# Patient Record
Sex: Female | Born: 1950
Health system: Southern US, Community
[De-identification: ages and names within clinical notes are randomized; demographics above are authoritative.]

## PROBLEM LIST (undated history)

## (undated) DIAGNOSIS — J40 Bronchitis, not specified as acute or chronic: Secondary | ICD-10-CM

## (undated) HISTORY — PX: BREAST LUMPECTOMY: SHX2

---

## 1998-09-19 ENCOUNTER — Encounter: Payer: Self-pay | Admitting: Family Medicine

## 1998-09-19 ENCOUNTER — Inpatient Hospital Stay (HOSPITAL_COMMUNITY): Admission: AD | Admit: 1998-09-19 | Discharge: 1998-09-21 | Payer: Self-pay | Admitting: Family Medicine

## 2001-06-08 ENCOUNTER — Other Ambulatory Visit: Admission: RE | Admit: 2001-06-08 | Discharge: 2001-06-08 | Payer: Self-pay | Admitting: Internal Medicine

## 2001-06-15 ENCOUNTER — Encounter: Payer: Self-pay | Admitting: Internal Medicine

## 2001-06-15 ENCOUNTER — Encounter: Admission: RE | Admit: 2001-06-15 | Discharge: 2001-06-15 | Payer: Self-pay | Admitting: Internal Medicine

## 2010-03-05 ENCOUNTER — Emergency Department (HOSPITAL_COMMUNITY): Admission: EM | Admit: 2010-03-05 | Discharge: 2010-03-05 | Payer: Self-pay | Admitting: Emergency Medicine

## 2010-12-09 LAB — PROTIME-INR
INR: 0.97 (ref 0.00–1.49)
Prothrombin Time: 12.8 seconds (ref 11.6–15.2)

## 2010-12-09 LAB — CBC
HCT: 41 % (ref 36.0–46.0)
Hemoglobin: 14.1 g/dL (ref 12.0–15.0)
MCHC: 34.5 g/dL (ref 30.0–36.0)
MCV: 92.4 fL (ref 78.0–100.0)
Platelets: 244 10*3/uL (ref 150–400)
RBC: 4.44 MIL/uL (ref 3.87–5.11)
RDW: 13.5 % (ref 11.5–15.5)
WBC: 14.3 10*3/uL — ABNORMAL HIGH (ref 4.0–10.5)

## 2015-07-13 ENCOUNTER — Other Ambulatory Visit (HOSPITAL_COMMUNITY): Payer: Self-pay | Admitting: Respiratory Therapy

## 2015-07-13 DIAGNOSIS — J45909 Unspecified asthma, uncomplicated: Secondary | ICD-10-CM

## 2016-04-18 DIAGNOSIS — I10 Essential (primary) hypertension: Secondary | ICD-10-CM | POA: Diagnosis not present

## 2016-04-18 DIAGNOSIS — J45909 Unspecified asthma, uncomplicated: Secondary | ICD-10-CM | POA: Diagnosis not present

## 2016-04-18 DIAGNOSIS — R911 Solitary pulmonary nodule: Secondary | ICD-10-CM | POA: Diagnosis not present

## 2016-05-21 ENCOUNTER — Institutional Professional Consult (permissible substitution): Payer: Self-pay | Admitting: Pulmonary Disease

## 2016-07-29 DIAGNOSIS — R05 Cough: Secondary | ICD-10-CM | POA: Diagnosis not present

## 2016-07-29 DIAGNOSIS — J209 Acute bronchitis, unspecified: Secondary | ICD-10-CM | POA: Diagnosis not present

## 2016-08-02 DIAGNOSIS — J209 Acute bronchitis, unspecified: Secondary | ICD-10-CM | POA: Diagnosis not present

## 2016-08-02 DIAGNOSIS — R05 Cough: Secondary | ICD-10-CM | POA: Diagnosis not present

## 2016-08-05 ENCOUNTER — Inpatient Hospital Stay (HOSPITAL_COMMUNITY)
Admission: EM | Admit: 2016-08-05 | Discharge: 2016-08-07 | DRG: 190 | Disposition: A | Discharge status: Home / Self Care | Payer: BLUE CROSS/BLUE SHIELD | Attending: Family Medicine | Admitting: Family Medicine

## 2016-08-05 ENCOUNTER — Emergency Department (HOSPITAL_COMMUNITY): Payer: BLUE CROSS/BLUE SHIELD

## 2016-08-05 ENCOUNTER — Encounter (HOSPITAL_COMMUNITY): Payer: Self-pay | Admitting: Family Medicine

## 2016-08-05 DIAGNOSIS — J9601 Acute respiratory failure with hypoxia: Secondary | ICD-10-CM | POA: Diagnosis not present

## 2016-08-05 DIAGNOSIS — Z7951 Long term (current) use of inhaled steroids: Secondary | ICD-10-CM

## 2016-08-05 DIAGNOSIS — E876 Hypokalemia: Secondary | ICD-10-CM | POA: Diagnosis present

## 2016-08-05 DIAGNOSIS — Z79899 Other long term (current) drug therapy: Secondary | ICD-10-CM

## 2016-08-05 DIAGNOSIS — Z880 Allergy status to penicillin: Secondary | ICD-10-CM | POA: Diagnosis not present

## 2016-08-05 DIAGNOSIS — R739 Hyperglycemia, unspecified: Secondary | ICD-10-CM

## 2016-08-05 DIAGNOSIS — J441 Chronic obstructive pulmonary disease with (acute) exacerbation: Principal | ICD-10-CM | POA: Diagnosis present

## 2016-08-05 DIAGNOSIS — F17210 Nicotine dependence, cigarettes, uncomplicated: Secondary | ICD-10-CM | POA: Diagnosis present

## 2016-08-05 DIAGNOSIS — R197 Diarrhea, unspecified: Secondary | ICD-10-CM | POA: Diagnosis present

## 2016-08-05 DIAGNOSIS — Z91018 Allergy to other foods: Secondary | ICD-10-CM

## 2016-08-05 DIAGNOSIS — R069 Unspecified abnormalities of breathing: Secondary | ICD-10-CM | POA: Diagnosis not present

## 2016-08-05 DIAGNOSIS — E1165 Type 2 diabetes mellitus with hyperglycemia: Secondary | ICD-10-CM | POA: Diagnosis not present

## 2016-08-05 DIAGNOSIS — J988 Other specified respiratory disorders: Secondary | ICD-10-CM | POA: Diagnosis not present

## 2016-08-05 DIAGNOSIS — R05 Cough: Secondary | ICD-10-CM | POA: Diagnosis not present

## 2016-08-05 HISTORY — DX: Bronchitis, not specified as acute or chronic: J40

## 2016-08-05 LAB — CBC WITH DIFFERENTIAL/PLATELET
BASOS ABS: 0 10*3/uL (ref 0.0–0.1)
Basophils Relative: 0 %
Eosinophils Absolute: 0.1 10*3/uL (ref 0.0–0.7)
Eosinophils Relative: 1 %
HEMATOCRIT: 48.8 % — AB (ref 36.0–46.0)
Hemoglobin: 16 g/dL — ABNORMAL HIGH (ref 12.0–15.0)
LYMPHS PCT: 27 %
Lymphs Abs: 3.7 10*3/uL (ref 0.7–4.0)
MCH: 30.7 pg (ref 26.0–34.0)
MCHC: 32.8 g/dL (ref 30.0–36.0)
MCV: 93.7 fL (ref 78.0–100.0)
MONO ABS: 0.8 10*3/uL (ref 0.1–1.0)
Monocytes Relative: 6 %
NEUTROS ABS: 9.3 10*3/uL — AB (ref 1.7–7.7)
Neutrophils Relative %: 66 %
Platelets: 253 10*3/uL (ref 150–400)
RBC: 5.21 MIL/uL — AB (ref 3.87–5.11)
RDW: 13.5 % (ref 11.5–15.5)
WBC: 13.8 10*3/uL — AB (ref 4.0–10.5)

## 2016-08-05 LAB — COMPREHENSIVE METABOLIC PANEL
ALT: 19 U/L (ref 14–54)
AST: 16 U/L (ref 15–41)
Albumin: 3.6 g/dL (ref 3.5–5.0)
Alkaline Phosphatase: 92 U/L (ref 38–126)
Anion gap: 12 (ref 5–15)
BUN: 12 mg/dL (ref 6–20)
CO2: 34 mmol/L — ABNORMAL HIGH (ref 22–32)
Calcium: 8.6 mg/dL — ABNORMAL LOW (ref 8.9–10.3)
Chloride: 95 mmol/L — ABNORMAL LOW (ref 101–111)
Creatinine, Ser: 0.58 mg/dL (ref 0.44–1.00)
GFR calc Af Amer: >60 mL/min (ref >60)
GFR calc non Af Amer: >60 mL/min (ref >60)
Glucose, Bld: 280 mg/dL — ABNORMAL HIGH (ref 65–99)
Potassium: 2.9 mmol/L — ABNORMAL LOW (ref 3.5–5.1)
Sodium: 141 mmol/L (ref 135–145)
Total Bilirubin: 0.7 mg/dL (ref 0.3–1.2)
Total Protein: 6.6 g/dL (ref 6.5–8.1)

## 2016-08-05 LAB — BRAIN NATRIURETIC PEPTIDE: B NATRIURETIC PEPTIDE 5: 32.6 pg/mL (ref 0.0–100.0)

## 2016-08-05 LAB — BLOOD GAS, VENOUS
Acid-Base Excess: 9.3 mmol/L — ABNORMAL HIGH (ref 0.0–2.0)
Bicarbonate: 36.2 mmol/L — ABNORMAL HIGH (ref 20.0–28.0)
Drawn by: 295031
O2 Content: 3 L/min
O2 Saturation: 89.4 %
Patient temperature: 98.6
pCO2, Ven: 58.4 mmHg (ref 44.0–60.0)
pH, Ven: 7.409 (ref 7.250–7.430)
pO2, Ven: 60.6 mmHg — ABNORMAL HIGH (ref 32.0–45.0)

## 2016-08-05 LAB — I-STAT TROPONIN, ED: Troponin i, poc: 0.02 ng/mL (ref 0.00–0.08)

## 2016-08-05 LAB — GLUCOSE, CAPILLARY
GLUCOSE-CAPILLARY: 422 mg/dL — AB (ref 65–99)
GLUCOSE-CAPILLARY: 517 mg/dL — AB (ref 65–99)

## 2016-08-05 LAB — MAGNESIUM: MAGNESIUM: 1.4 mg/dL — AB (ref 1.7–2.4)

## 2016-08-05 MED ORDER — HYDROCODONE-ACETAMINOPHEN 5-325 MG PO TABS: 1.0000 | ORAL_TABLET | ORAL | Status: DC | PRN

## 2016-08-05 MED ORDER — LEVOFLOXACIN IN D5W 750 MG/150ML IV SOLN
750.0000 mg | INTRAVENOUS | Status: DC
  Administered 2016-08-06 (×2): 750 mg via INTRAVENOUS
  Filled 2016-08-05 (×2): qty 150

## 2016-08-05 MED ORDER — ADULT MULTIVITAMIN W/MINERALS CH
1.0000 | ORAL_TABLET | Freq: Every day | ORAL | Status: DC
  Administered 2016-08-06 – 2016-08-07 (×2): 1 via ORAL
  Filled 2016-08-05 (×2): qty 1

## 2016-08-05 MED ORDER — INSULIN ASPART 100 UNIT/ML ~~LOC~~ SOLN
10.0000 [IU] | Freq: Once | SUBCUTANEOUS | Status: AC
  Administered 2016-08-05: 10 [IU] via SUBCUTANEOUS

## 2016-08-05 MED ORDER — SODIUM CHLORIDE 0.9% FLUSH
3.0000 mL | Freq: Two times a day (BID) | INTRAVENOUS | Status: DC
  Administered 2016-08-06 – 2016-08-07 (×2): 3 mL via INTRAVENOUS

## 2016-08-05 MED ORDER — SODIUM CHLORIDE 0.9 % IV BOLUS (SEPSIS)
1000.0000 mL | Freq: Once | INTRAVENOUS | Status: AC
  Administered 2016-08-05: 1000 mL via INTRAVENOUS

## 2016-08-05 MED ORDER — IPRATROPIUM-ALBUTEROL 0.5-2.5 (3) MG/3ML IN SOLN
3.0000 mL | Freq: Once | RESPIRATORY_TRACT | Status: AC
  Administered 2016-08-05: 3 mL via RESPIRATORY_TRACT
  Filled 2016-08-05: qty 3

## 2016-08-05 MED ORDER — NICOTINE 21 MG/24HR TD PT24
21.0000 mg | MEDICATED_PATCH | Freq: Every day | TRANSDERMAL | Status: DC
  Administered 2016-08-05 – 2016-08-07 (×3): 21 mg via TRANSDERMAL
  Filled 2016-08-05 (×4): qty 1

## 2016-08-05 MED ORDER — DIPHENHYDRAMINE HCL 25 MG PO CAPS
25.0000 mg | ORAL_CAPSULE | Freq: Four times a day (QID) | ORAL | Status: DC | PRN
  Administered 2016-08-05 – 2016-08-06 (×2): 25 mg via ORAL
  Filled 2016-08-05 (×2): qty 1

## 2016-08-05 MED ORDER — CYANOCOBALAMIN 500 MCG PO TABS
500.0000 ug | ORAL_TABLET | Freq: Every day | ORAL | Status: DC
  Administered 2016-08-06 – 2016-08-07 (×2): 500 ug via ORAL
  Filled 2016-08-05 (×2): qty 1

## 2016-08-05 MED ORDER — POTASSIUM CHLORIDE CRYS ER 20 MEQ PO TBCR
40.0000 meq | EXTENDED_RELEASE_TABLET | Freq: Once | ORAL | Status: AC
Start: 2016-08-05 — End: 2016-08-05
  Administered 2016-08-05: 40 meq via ORAL
  Filled 2016-08-05: qty 2

## 2016-08-05 MED ORDER — ENOXAPARIN SODIUM 40 MG/0.4ML ~~LOC~~ SOLN
40.0000 mg | SUBCUTANEOUS | Status: DC
  Administered 2016-08-05 – 2016-08-06 (×2): 40 mg via SUBCUTANEOUS
  Filled 2016-08-05 (×2): qty 0.4

## 2016-08-05 MED ORDER — MOMETASONE FURO-FORMOTEROL FUM 100-5 MCG/ACT IN AERO
2.0000 | INHALATION_SPRAY | Freq: Two times a day (BID) | RESPIRATORY_TRACT | Status: DC
  Administered 2016-08-05 – 2016-08-07 (×4): 2 via RESPIRATORY_TRACT
  Filled 2016-08-05: qty 8.8

## 2016-08-05 MED ORDER — SODIUM CHLORIDE 0.9% FLUSH: 3.0000 mL | INTRAVENOUS | Status: DC | PRN

## 2016-08-05 MED ORDER — IBUPROFEN 200 MG PO TABS
400.0000 mg | ORAL_TABLET | Freq: Four times a day (QID) | ORAL | Status: DC | PRN
  Administered 2016-08-05 – 2016-08-06 (×2): 400 mg via ORAL
  Filled 2016-08-05 (×2): qty 2

## 2016-08-05 MED ORDER — SODIUM CHLORIDE 0.9 % IV SOLN: 250.0000 mL | INTRAVENOUS | Status: DC | PRN

## 2016-08-05 MED ORDER — FLUTICASONE PROPIONATE 50 MCG/ACT NA SUSP
1.0000 | Freq: Every day | NASAL | Status: DC | PRN
  Filled 2016-08-05: qty 16

## 2016-08-05 MED ORDER — ONDANSETRON HCL 4 MG/2ML IJ SOLN: 4.0000 mg | Freq: Four times a day (QID) | INTRAMUSCULAR | Status: DC | PRN

## 2016-08-05 MED ORDER — ONDANSETRON HCL 4 MG PO TABS: 4.0000 mg | ORAL_TABLET | Freq: Four times a day (QID) | ORAL | Status: DC | PRN

## 2016-08-05 MED ORDER — POTASSIUM CHLORIDE 2 MEQ/ML IV SOLN
30.0000 meq | Freq: Once | INTRAVENOUS | Status: AC
  Administered 2016-08-05: 30 meq via INTRAVENOUS
  Filled 2016-08-05: qty 15

## 2016-08-05 MED ORDER — MAGNESIUM SULFATE 2 GM/50ML IV SOLN
2.0000 g | Freq: Once | INTRAVENOUS | Status: AC
  Administered 2016-08-05: 2 g via INTRAVENOUS
  Filled 2016-08-05: qty 50

## 2016-08-05 MED ORDER — METHYLPREDNISOLONE SODIUM SUCC 125 MG IJ SOLR
60.0000 mg | Freq: Four times a day (QID) | INTRAMUSCULAR | Status: DC
  Administered 2016-08-05: 60 mg via INTRAVENOUS
  Filled 2016-08-05: qty 2

## 2016-08-05 MED ORDER — INSULIN ASPART 100 UNIT/ML ~~LOC~~ SOLN: 0.0000 [IU] | Freq: Every day | SUBCUTANEOUS | Status: DC

## 2016-08-05 MED ORDER — METHYLPREDNISOLONE SODIUM SUCC 125 MG IJ SOLR
60.0000 mg | Freq: Three times a day (TID) | INTRAMUSCULAR | Status: DC
  Administered 2016-08-06 – 2016-08-07 (×4): 60 mg via INTRAVENOUS
  Filled 2016-08-05 (×4): qty 2

## 2016-08-05 MED ORDER — ZOLPIDEM TARTRATE 5 MG PO TABS: 5.0000 mg | ORAL_TABLET | Freq: Every evening | ORAL | Status: DC | PRN

## 2016-08-05 MED ORDER — IPRATROPIUM-ALBUTEROL 0.5-2.5 (3) MG/3ML IN SOLN: 3.0000 mL | RESPIRATORY_TRACT | Status: DC | PRN

## 2016-08-05 MED ORDER — SODIUM CHLORIDE 0.9% FLUSH
3.0000 mL | Freq: Two times a day (BID) | INTRAVENOUS | Status: DC
  Administered 2016-08-05 – 2016-08-06 (×2): 3 mL via INTRAVENOUS

## 2016-08-05 MED ORDER — INSULIN ASPART 100 UNIT/ML ~~LOC~~ SOLN
0.0000 [IU] | Freq: Three times a day (TID) | SUBCUTANEOUS | Status: DC
  Administered 2016-08-06: 9 [IU] via SUBCUTANEOUS
  Administered 2016-08-06: 5 [IU] via SUBCUTANEOUS
  Administered 2016-08-06: 7 [IU] via SUBCUTANEOUS
  Administered 2016-08-07 (×2): 9 [IU] via SUBCUTANEOUS

## 2016-08-05 NOTE — ED Provider Notes (Signed)
[facility]-EMERGENCY DEPT Provider Note   CSN: [id number] Arrival date & time: 08/05/16  1709     History   Chief Complaint Chief Complaint  Patient presents with  . Cough    HPI Amanda Barajas is a 65 y.o. female.  The history is provided by the patient.  Cough  This is a new problem. The current episode started more than 1 week ago. The problem occurs constantly. The problem has been gradually worsening. The cough is productive of sputum. There has been no fever. Associated symptoms include shortness of breath and wheezing. Pertinent negatives include no chest pain. Treatments tried: prednisone 40 mg. The treatment provided no relief. She is a smoker. Her past medical history is significant for COPD.    Past Medical History:  Diagnosis Date  . Bronchitis     Patient Active Problem List   Diagnosis Date Noted  . COPD with acute exacerbation (HCC) 08/05/2016  . Hypokalemia 08/05/2016  . Hyperglycemia 08/05/2016  . Acute respiratory failure with hypoxia (HCC) 08/05/2016    Past Surgical History:  Procedure Laterality Date  . BREAST LUMPECTOMY Left    age 74 - was benign  . CESAREAN SECTION     x 3    OB History    No data available       Home Medications    Prior to Admission medications   Medication Sig Start Date End Date Taking? Authorizing Provider  albuterol (PROVENTIL HFA;VENTOLIN HFA) 108 (90 Base) MCG/ACT inhaler Inhale 1-2 puffs into the lungs every 4 (four) hours as needed for wheezing or shortness of breath.   Yes Historical Provider, MD  albuterol (PROVENTIL) (2.5 MG/3ML) 0.083% nebulizer solution Take 2.5 mg by nebulization every 4 (four) hours as needed for wheezing or shortness of breath.   Yes Historical Provider, MD  azithromycin (ZITHROMAX) 250 MG tablet Take 250-500 mg by mouth daily. Take 500mg  on day 1, then 250mg  for 4 days  Started 11/11 for 5 days   Yes Historical Provider, MD  budesonide-formoterol (SYMBICORT) 80-4.5 MCG/ACT inhaler Inhale  2 puffs into the lungs 2 (two) times daily.   Yes Historical Provider, MD  diphenhydramine-acetaminophen (TYLENOL PM) 25-500 MG TABS tablet Take 2 tablets by mouth at bedtime.   Yes Historical Provider, MD  fluticasone (FLONASE) 50 MCG/ACT nasal spray Place 1 spray into both nostrils daily as needed for allergies or rhinitis.   Yes Historical Provider, MD  ibuprofen (ADVIL,MOTRIN) 200 MG tablet Take 400 mg by mouth every 6 (six) hours as needed for fever, headache, mild pain, moderate pain or cramping.   Yes Historical Provider, MD  Ipratropium-Albuterol (COMBIVENT RESPIMAT) 20-100 MCG/ACT AERS respimat Inhale 1 puff into the lungs 4 (four) times daily.   Yes Historical Provider, MD  Multiple Vitamin (MULTIVITAMIN WITH MINERALS) TABS tablet Take 1 tablet by mouth daily.   Yes Historical Provider, MD  predniSONE (DELTASONE) 20 MG tablet Take 10 mg by mouth daily. Started 11/11 for 10 days   Yes Historical Provider, MD  vitamin B-12 (CYANOCOBALAMIN) 500 MCG tablet Take 500 mcg by mouth daily.   Yes Historical Provider, MD    Family History Family History  Problem Relation Age of Onset  . Heart attack Mother   . Emphysema Mother   . Heart attack Father     Social History Social History  Substance Use Topics  . Smoking status: Current Every Day Smoker    Packs/day: 2.00    Years: 46.00  . Smokeless tobacco: Never Used  .  Alcohol use No     Allergies   Penicillins and Eggs or egg-derived products   Review of Systems Review of Systems  Constitutional: Positive for fatigue. Negative for fever.  Respiratory: Positive for cough, shortness of breath and wheezing.   Cardiovascular: Negative for chest pain.  All other systems reviewed and are negative.    Physical Exam Updated Vital Signs BP (!) 158/72 (BP Location: Right Arm)   Pulse 88   Temp 99.1 F (37.3 C) (Oral)   Resp (!) 22   Ht 5\' 2"  (1.575 m)   Wt 192 lb 9.6 oz (87.4 kg)   SpO2 93%   BMI 35.23 kg/m   Physical Exam   Constitutional: She is oriented to person, place, and time. She appears well-developed and well-nourished.  HENT:  Head: Normocephalic and atraumatic.  Eyes: Right eye exhibits no discharge.  Cardiovascular: Normal rate, regular rhythm and normal heart sounds.   No murmur heard. Pulmonary/Chest: She has wheezes. She has rales.  tachypnea  Abdominal: Soft. She exhibits no distension. There is no tenderness.  Neurological: She is oriented to person, place, and time.  Skin: Skin is warm and dry. She is not diaphoretic.  Psychiatric: She has a normal mood and affect.  Nursing note and vitals reviewed.    ED Treatments / Results  Labs (all labs ordered are listed, but only abnormal results are displayed) Labs Reviewed  COMPREHENSIVE METABOLIC PANEL - Abnormal; Notable for the following:       Result Value   Potassium 2.9 (*)    Chloride 95 (*)    CO2 34 (*)    Glucose, Bld 280 (*)    Calcium 8.6 (*)    All other components within normal limits  CBC WITH DIFFERENTIAL/PLATELET - Abnormal; Notable for the following:    WBC 13.8 (*)    RBC 5.21 (*)    Hemoglobin 16.0 (*)    HCT 48.8 (*)    Neutro Abs 9.3 (*)    All other components within normal limits  BLOOD GAS, VENOUS - Abnormal; Notable for the following:    pO2, Ven 60.6 (*)    Bicarbonate 36.2 (*)    Acid-Base Excess 9.3 (*)    All other components within normal limits  MAGNESIUM - Abnormal; Notable for the following:    Magnesium 1.4 (*)    All other components within normal limits  GLUCOSE, CAPILLARY - Abnormal; Notable for the following:    Glucose-Capillary 422 (*)    All other components within normal limits  GLUCOSE, CAPILLARY - Abnormal; Notable for the following:    Glucose-Capillary 517 (*)    All other components within normal limits  CULTURE, EXPECTORATED SPUTUM-ASSESSMENT  BRAIN NATRIURETIC PEPTIDE  BASIC METABOLIC PANEL  HEMOGLOBIN A1C  MAGNESIUM  I-STAT TROPOININ, ED    EKG  EKG  Interpretation  Date/Time:  Tuesday August 05 2016 19:12:48 EST Ventricular Rate:  96 PR Interval:    QRS Duration: 100 QT Interval:  375 QTC Calculation: 472 R Axis:   111 Text Interpretation:  Sinus rhythm Left atrial enlargement Anterolateral infarct, old Baseline wander in lead(s) I II III aVR aVL V1 V2 V3 V4 V5 V6 No significant change since last tracing Confirmed by Kandis MannanMACKUEN, COURTNEY ([id number]) on 08/05/2016 7:31:29 PM       Radiology Dg Chest 2 View  Result Date: 08/05/2016 CLINICAL DATA:  Possible pneumonia, recent completion of the Z-Pak , still feeling unwell. Productive cough. EXAM: CHEST  2 VIEW COMPARISON:  None.  FINDINGS: Cardiac silhouette is upper limits of normal, mediastinal silhouette is nonsuspicious, mildly calcified aortic knob. No pleural effusion or focal consolidation. No pneumothorax. Soft tissue planes and included osseous structures are nonsuspicious. Severe cervical facet arthropathy. IMPRESSION: Borderline cardiomegaly, no acute pulmonary process. Electronically Signed   By: Awilda Metroourtnay  Bloomer M.D.   On: 08/05/2016 19:02    Procedures Procedures (including critical care time)  Medications Ordered in ED Medications  mometasone-formoterol (DULERA) 100-5 MCG/ACT inhaler 2 puff (2 puffs Inhalation Given 08/05/16 2337)  fluticasone (FLONASE) 50 MCG/ACT nasal spray 1 spray (not administered)  ibuprofen (ADVIL,MOTRIN) tablet 400 mg (400 mg Oral Given 08/05/16 2347)  multivitamin with minerals tablet 1 tablet (not administered)  cyanocobalamin tablet 500 mcg (not administered)  enoxaparin (LOVENOX) injection 40 mg (40 mg Subcutaneous Given 08/05/16 2314)  sodium chloride flush (NS) 0.9 % injection 3 mL (3 mLs Intravenous Given 08/05/16 2200)  sodium chloride flush (NS) 0.9 % injection 3 mL (0 mLs Intravenous Duplicate 08/05/16 2200)  sodium chloride flush (NS) 0.9 % injection 3 mL (not administered)  0.9 %  sodium chloride infusion (not administered)   HYDROcodone-acetaminophen (NORCO/VICODIN) 5-325 MG per tablet 1-2 tablet (not administered)  ondansetron (ZOFRAN) tablet 4 mg (not administered)    Or  ondansetron (ZOFRAN) injection 4 mg (not administered)  insulin aspart (novoLOG) injection 0-9 Units (not administered)  insulin aspart (novoLOG) injection 0-5 Units (0 Units Subcutaneous Not Given 08/05/16 2340)  ipratropium-albuterol (DUONEB) 0.5-2.5 (3) MG/3ML nebulizer solution 3 mL (not administered)  levofloxacin (LEVAQUIN) IVPB 750 mg (750 mg Intravenous Given 08/06/16 0040)  nicotine (NICODERM CQ - dosed in mg/24 hours) patch 21 mg (21 mg Transdermal Patch Applied 08/05/16 2313)  potassium chloride 30 mEq in sodium chloride 0.9 % 265 mL (KCL MULTIRUN) IVPB (30 mEq Intravenous Given 08/05/16 2314)  zolpidem (AMBIEN) tablet 5 mg (not administered)  diphenhydrAMINE (BENADRYL) capsule 25-50 mg (25 mg Oral Given 08/05/16 2347)  methylPREDNISolone sodium succinate (SOLU-MEDROL) 125 mg/2 mL injection 60 mg (not administered)  ipratropium-albuterol (DUONEB) 0.5-2.5 (3) MG/3ML nebulizer solution 3 mL (3 mLs Nebulization Given 08/05/16 1838)  sodium chloride 0.9 % bolus 1,000 mL (1,000 mLs Intravenous New Bag/Given 08/05/16 1901)  potassium chloride SA (K-DUR,KLOR-CON) CR tablet 40 mEq (40 mEq Oral Given 08/05/16 1949)  magnesium sulfate IVPB 2 g 50 mL (2 g Intravenous Given 08/05/16 2314)  insulin aspart (novoLOG) injection 10 Units (10 Units Subcutaneous Given 08/05/16 2347)     Initial Impression / Assessment and Plan / ED Course  I have reviewed the triage vital signs and the nursing notes.  Pertinent labs & imaging results that were available during my care of the patient were reviewed by me and considered in my medical decision making (see chart for details).  Clinical Course     Pt is a former smoker (quit 4 days ago) here with worsening wheezing SOB for the last week. [facility]. Has been getting  worse.  Pt dips to 86% on RA, has coarse breath sounds and wheezing throughout.  Will start duonebs, solumedrol given by EMS and get CXR.  CXR shows no infection, will treat for COPD exacerbation. 125 solumedrol given PTA   Will need to admit for new oxygen dependence.   Final Clinical Impressions(s) / ED Diagnoses   Final diagnoses:  None    New Prescriptions Current Discharge Medication List       Rontae Inglett Randall AnLyn Okie Bogacz, MD 08/06/16 0121

## 2016-08-05 NOTE — ED Triage Notes (Signed)
Pt was seen by PCP on 08/02/16 and dx with possible PNA; pt wad rx'd Z-pack. Pt began to feel better but still not normal. Pt returned to PCP where she was advised to seek further evaluation at ED. Pt presents with productive cough with dark green sputum, shob, and decreased O2 sat. Pt recently quit smoking.

## 2016-08-05 NOTE — ED Notes (Signed)
Respiratory called for breathing treatment.

## 2016-08-05 NOTE — ED Notes (Signed)
Hospitalist at bedside 

## 2016-08-05 NOTE — H&P (Signed)
History and Physical    Amanda Barajas ZOX:[id number] DOB: 02-16-1951 DOA: 08/05/2016  PCP: Jarrett SohoWharton, Courtney, PA-C   Patient coming from: Home, by way of urgent care  Chief Complaint: Dyspnea, cough, low sat  HPI: Amanda JohnDebra B Heath is a 65 y.o. female with medical history significant for COPD and tobacco abuse, presenting to the emergency department at the direction of an urgent care provider for evaluation of hypoxia. Patient reports that she had developed increased cough and increased dyspnea with wheezing a little over a week ago and was seen in an urgent care on 08/02/2016, diagnosed with acute bronchitis, and treated with azithromycin and prednisone. Patient reports some mild improvement with these measures, but her family at the bedside report that she appears to be worsening with increased work of breathing and increased breathlessness with speech. For this reason, she was taken back to urgent care today for follow-up, was noted to be saturating in the low 80s on room air, and was directed to the ED for further evaluation. Patient denies any chest pain or palpitations, and denies any lower extremity edema or orthopnea. She reports a cough productive of thick green sputum for approximately one week now and dyspnea, initially with exertion, but now at rest, and audible wheezing. She reports that her last cigarette was 2 days prior to admission and she declares that she has quit. Patient was treated with 125 mg of IV Solu-Medrol at the urgent care prior to her arrival in the ED.  ED Course: Upon arrival to the ED, patient is found to be afebrile, saturating 82% on room air, mildly tachypneic, mildly hypertensive, then with vitals otherwise stable. EKG demonstrates sinus rhythm with left atrial enlargement and chest x-ray is negative for acute cardiopulmonary disease. CMP is notable for potassium of 2.9, bicarbonate of 34, and serum glucose of 280. CBC is notable for a leukocytosis to 13,800 and  polycythemia with hemoglobin of 16.0. Troponin is within the normal limits. Patient was given 1 L of normal saline, 40 mEq of oral potassium, and treated with DuoNeb. Despite these measures, patient continues to require supplemental oxygen while at rest and is dyspneic with minimal exertion and even with speech. She will be admitted to the telemetry unit for ongoing evaluation and management of acute hypoxic respiratory failure suspected secondary to acute exacerbation and COPD.  Review of Systems:  All other systems reviewed and apart from HPI, are negative.  Past Medical History:  Diagnosis Date  . Bronchitis     Past Surgical History:  Procedure Laterality Date  . BREAST LUMPECTOMY Left    age 65 - was benign  . CESAREAN SECTION     x 3     reports that she has been smoking.  She has a 92.00 pack-year smoking history. She has never used smokeless tobacco. She reports that she does not drink alcohol or use drugs.  Allergies  Allergen Reactions  . Penicillins Hives    Has patient had a PCN reaction causing immediate rash, facial/tongue/throat swelling, SOB or lightheadedness with hypotension: Yes Has patient had a PCN reaction causing severe rash involving mucus membranes or skin necrosis: Yes Has patient had a PCN reaction that required hospitalization No Has patient had a PCN reaction occurring within the last 10 years: No  If all of the above answers are "NO", then may proceed with Cephalosporin use.   . Eggs Or Egg-Derived Products Diarrhea and Nausea And Vomiting    Family History  Problem Relation Age of  Onset  . Heart attack Mother   . Emphysema Mother   . Heart attack Father      Prior to Admission medications   Medication Sig Start Date End Date Taking? Authorizing Provider  albuterol (PROVENTIL HFA;VENTOLIN HFA) 108 (90 Base) MCG/ACT inhaler Inhale 1-2 puffs into the lungs every 4 (four) hours as needed for wheezing or shortness of breath.   Yes Historical  Provider, MD  albuterol (PROVENTIL) (2.5 MG/3ML) 0.083% nebulizer solution Take 2.5 mg by nebulization every 4 (four) hours as needed for wheezing or shortness of breath.   Yes Historical Provider, MD  azithromycin (ZITHROMAX) 250 MG tablet Take 250-500 mg by mouth daily. Take 500mg  on day 1, then 250mg  for 4 days  Started 11/11 for 5 days   Yes Historical Provider, MD  budesonide-formoterol (SYMBICORT) 80-4.5 MCG/ACT inhaler Inhale 2 puffs into the lungs 2 (two) times daily.   Yes Historical Provider, MD  diphenhydramine-acetaminophen (TYLENOL PM) 25-500 MG TABS tablet Take 2 tablets by mouth at bedtime.   Yes Historical Provider, MD  fluticasone (FLONASE) 50 MCG/ACT nasal spray Place 1 spray into both nostrils daily as needed for allergies or rhinitis.   Yes Historical Provider, MD  ibuprofen (ADVIL,MOTRIN) 200 MG tablet Take 400 mg by mouth every 6 (six) hours as needed for fever, headache, mild pain, moderate pain or cramping.   Yes Historical Provider, MD  Ipratropium-Albuterol (COMBIVENT RESPIMAT) 20-100 MCG/ACT AERS respimat Inhale 1 puff into the lungs 4 (four) times daily.   Yes Historical Provider, MD  Multiple Vitamin (MULTIVITAMIN WITH MINERALS) TABS tablet Take 1 tablet by mouth daily.   Yes Historical Provider, MD  predniSONE (DELTASONE) 20 MG tablet Take 10 mg by mouth daily. Started 11/11 for 10 days   Yes Historical Provider, MD  vitamin B-12 (CYANOCOBALAMIN) 500 MCG tablet Take 500 mcg by mouth daily.   Yes Historical Provider, MD    Physical Exam: Vitals:   08/05/16 1719 08/05/16 1721 08/05/16 1742 08/05/16 1950  BP: 174/79   (!) 144/113  Pulse: 97   97  Resp: 22   (!) 27  Temp: 98.7 F (37.1 C)     TempSrc: Oral     SpO2: (!) 82%  91% 91%  Weight:  87.1 kg (192 lb)    Height:  5\' 2"  (1.575 m)        Constitutional: Mildly tachypneic, calm, comfortable Eyes: PERTLA, lids and conjunctivae normal ENMT: Mucous membranes are moist. Posterior pharynx clear of any exudate  or lesions.   Neck: normal, supple, no masses, no thyromegaly Respiratory: Diminished bilaterally with prolonged expiratory phase and scattered wheezes bilaterally. No pallor.  Cardiovascular: Rate ~80 and regular with no significant murmur appreciated. No significant JVD. Abdomen: No distension, no tenderness, no masses palpated. Bowel sounds normal.  Musculoskeletal: no clubbing / cyanosis. No joint deformity upper and lower extremities. Normal muscle tone.  Skin: no significant rashes, lesions, ulcers. Warm, dry, well-perfused. Neurologic: CN 2-12 grossly intact. Sensation intact, DTR normal. Strength 5/5 in all 4 limbs.  Psychiatric: Normal judgment and insight. Alert and oriented x 3. Normal mood and affect.     Labs on Admission: I have personally reviewed following labs and imaging studies  CBC:  Recent Labs Lab 08/05/16 1829  WBC 13.8*  NEUTROABS 9.3*  HGB 16.0*  HCT 48.8*  MCV 93.7  PLT 253   Basic Metabolic Panel:  Recent Labs Lab 08/05/16 1829  NA 141  K 2.9*  CL 95*  CO2 34*  GLUCOSE  280*  BUN 12  CREATININE 0.58  CALCIUM 8.6*   GFR: Estimated Creatinine Clearance: 74.7 mL/min (by C-G formula based on SCr of 0.58 mg/dL). Liver Function Tests:  Recent Labs Lab 08/05/16 1829  AST 16  ALT 19  ALKPHOS 92  BILITOT 0.7  PROT 6.6  ALBUMIN 3.6   No results for input(s): LIPASE, AMYLASE in the last 168 hours. No results for input(s): AMMONIA in the last 168 hours. Coagulation Profile: No results for input(s): INR, PROTIME in the last 168 hours. Cardiac Enzymes: No results for input(s): CKTOTAL, CKMB, CKMBINDEX, TROPONINI in the last 168 hours. BNP (last 3 results) No results for input(s): PROBNP in the last 8760 hours. HbA1C: No results for input(s): HGBA1C in the last 72 hours. CBG: No results for input(s): GLUCAP in the last 168 hours. Lipid Profile: No results for input(s): CHOL, HDL, LDLCALC, TRIG, CHOLHDL, LDLDIRECT in the last 72  hours. Thyroid Function Tests: No results for input(s): TSH, T4TOTAL, FREET4, T3FREE, THYROIDAB in the last 72 hours. Anemia Panel: No results for input(s): VITAMINB12, FOLATE, FERRITIN, TIBC, IRON, RETICCTPCT in the last 72 hours. Urine analysis: No results found for: COLORURINE, APPEARANCEUR, LABSPEC, PHURINE, GLUCOSEU, HGBUR, BILIRUBINUR, KETONESUR, PROTEINUR, UROBILINOGEN, NITRITE, LEUKOCYTESUR Sepsis Labs: @LABRCNTIP (procalcitonin:4,lacticidven:4) )No results found for this or any previous visit (from the past 240 hour(s)).   Radiological Exams on Admission: Dg Chest 2 View  Result Date: 08/05/2016 CLINICAL DATA:  Possible pneumonia, recent completion of the Z-Pak , still feeling unwell. Productive cough. EXAM: CHEST  2 VIEW COMPARISON:  None. FINDINGS: Cardiac silhouette is upper limits of normal, mediastinal silhouette is nonsuspicious, mildly calcified aortic knob. No pleural effusion or focal consolidation. No pneumothorax. Soft tissue planes and included osseous structures are nonsuspicious. Severe cervical facet arthropathy. IMPRESSION: Borderline cardiomegaly, no acute pulmonary process. Electronically Signed   By: Awilda Metroourtnay  Bloomer M.D.   On: 08/05/2016 19:02    EKG: Independently reviewed. Sinus rhythm, LAE  Assessment/Plan  1. COPD with acute exacerbation, acute hypoxic respiratory failure - Pt was started on azithromycin and prednisone on 08/02/16 after visiting a urgent care, but family was concerned she was continuing to worsen and she is now hypoxic in the low 80s while at rest  - There is no edema or orthopnea; CXR is neg for acute pathology  - She was treated with 125 mg IV Solu-Medrol en route and given nebs in ED - Plan to continue her scheduled ICS/LABA, prn DuoNeb, Solu-Medrol; check sputum culture and start Levaquin   2. Hypokalemia  - Serum potassium is 2.9 on admission  - Likely secondary to diarrhea, which patient said she gets every time she goes on  prednisone burst - She was given 40 mEq oral potassium in ED and 30 mEq IV potassium on admission  - Check chem panel with mag level in am, monitor on telemetry   3. Hyperglycemia  - Serum glucose 280 on admission without hx of DM, likely secondary to steroids, and possibly undiagnosed DM  - Plan to check CBG with meals and qHS; check A1c  - Start a low-intensity SSI and adjust prn     DVT prophylaxis: sq Lovenox  Code Status: Full  Family Communication: Husband and son updated at bedside at patient's request Disposition Plan: Admit to telemetry Consults called: none Admission status: Inpatient    Briscoe Deutscherimothy S Opyd, MD [facility] Pager [phone]  If 7PM-7AM, please contact night-coverage [email or web address] Password [email or web address]  08/05/2016, 8:18 PM

## 2016-08-05 NOTE — ED Notes (Signed)
ED Provider at bedside. 

## 2016-08-05 NOTE — ED Notes (Signed)
Bed: WA02 Expected date:  Expected time:  Means of arrival:  Comments: EMS-PNA/SOB

## 2016-08-06 ENCOUNTER — Encounter (HOSPITAL_COMMUNITY): Payer: Self-pay | Admitting: Family Medicine

## 2016-08-06 DIAGNOSIS — J441 Chronic obstructive pulmonary disease with (acute) exacerbation: Principal | ICD-10-CM

## 2016-08-06 LAB — BASIC METABOLIC PANEL
ANION GAP: 9 (ref 5–15)
BUN: 15 mg/dL (ref 6–20)
CALCIUM: 8 mg/dL — AB (ref 8.9–10.3)
CO2: 33 mmol/L — ABNORMAL HIGH (ref 22–32)
Chloride: 97 mmol/L — ABNORMAL LOW (ref 101–111)
Creatinine, Ser: 0.65 mg/dL (ref 0.44–1.00)
GFR calc Af Amer: >60 mL/min (ref >60)
Glucose, Bld: 382 mg/dL — ABNORMAL HIGH (ref 65–99)
POTASSIUM: 4.3 mmol/L (ref 3.5–5.1)
SODIUM: 139 mmol/L (ref 135–145)

## 2016-08-06 LAB — GLUCOSE, CAPILLARY
GLUCOSE-CAPILLARY: 287 mg/dL — AB (ref 65–99)
GLUCOSE-CAPILLARY: 366 mg/dL — AB (ref 65–99)
Glucose-Capillary: 372 mg/dL — ABNORMAL HIGH (ref 65–99)
Glucose-Capillary: 341 mg/dL — ABNORMAL HIGH (ref 65–99)
Glucose-Capillary: 420 mg/dL — ABNORMAL HIGH (ref 65–99)

## 2016-08-06 LAB — MAGNESIUM: MAGNESIUM: 2.1 mg/dL (ref 1.7–2.4)

## 2016-08-06 MED ORDER — INSULIN ASPART 100 UNIT/ML ~~LOC~~ SOLN
10.0000 [IU] | Freq: Once | SUBCUTANEOUS | Status: AC
  Administered 2016-08-06: 10 [IU] via SUBCUTANEOUS

## 2016-08-06 NOTE — Progress Notes (Signed)
OT Cancellation Note  Patient Details Name: Amanda JohnDebra B Tavis MRN: [id number] DOB: [birth year]   Cancelled Treatment:    Reason Eval/Treat Not Completed: OT screened, no needs identified, will sign off  Keyle Doby, [place] KungLorraine D 08/06/2016, 1:31 PM

## 2016-08-06 NOTE — Progress Notes (Signed)
Initial Nutrition Assessment  DOCUMENTATION CODES:   Obesity unspecified  INTERVENTION:  Spoke with [facility] to clarify that patient does not have true egg allergy and that she can tolerate boiled eggs, and baked products containing eggs.  Encouraged adequate intake of calories and protein at meals in setting of increased needs during acute exacerbation. Spoke with patient about good options she can have here or items that will be well-tolerated during any future exacerbations when appetite is poor.  NUTRITION DIAGNOSIS:   Increased nutrient needs related to catabolic illness (COPD exacerbation on steroid taper) as evidenced by estimated needs.  GOAL:   Patient will meet greater than or equal to 90% of their needs  MONITOR:   PO intake, Labs, Weight trends, I & O's  REASON FOR ASSESSMENT:   Consult Assessment of nutrition requirement/status  ASSESSMENT:   65 y.o. female with medical history significant for COPD and tobacco abuse, presenting to the emergency department at the direction of an urgent care provider for evaluation of hypoxia. Found to have acute COPD exacerbation and acute hypoxic respiratory failure.   Spoke with patient at bedside. Reports exacerbation symptoms began about 1 week ago. During that time she had a poor appetite and decreased intake (only finishing 50% of meals). Her typical intake is 2 meals daily (shown below). Denies N/V or abdominal pain. Reports diarrhea with prednisone. Patient also reports that she had mistakenly told someone on admission she had an egg allergy. Reports it is not a true allergy as she is still able to tolerate boiled eggs, and eggs baked in other products. Only cannot tolerate scrambled eggs so she wants the allergy taken off so she can eat more foods. Patient reports appetite back to normal now and she is finishing meals - had finished 100% of breakfast at time of assessment. Reports she does not want an ONS or snacks  ordered.  Typical Intake:  Breakfast - pack of crackers at work [employer]unch - leftovers or Administrator, [employer] Servicesandwich Dinner - "full meal" (meat/fish, rice/bread, vegetables)  Meal Completion: 100%  Medications reviewed and include: Novolog sliding scale TID with meals and daily at bedtime, methylprednisolone 60 mg Q8hrs, multivitamin with  Minerals daily.  Labs reviewed: CBG 287-517 past 24 hrs, Chloride 97, CO2 33. Potassium now WNL after repletion - was low upon admission in setting of diarrhea.  Nutrition-Focused physical exam completed. Findings are no fat depletion, no muscle depletion, and no edema.   Diet Order:  Diet regular Room service appropriate? Yes; Fluid consistency: Thin  Skin:  Reviewed, no issues  Last BM:  Unknown  Height:   Ht Readings from Last 1 Encounters:  08/05/16 5\' 2"  (1.575 m)    Weight:   Wt Readings from Last 1 Encounters:  08/06/16 195 lb 11.2 oz (88.8 kg)    Ideal Body Weight:  50 kg  BMI:  Body mass index is 35.79 kg/m.  Estimated Nutritional Needs:   Kcal:  1825-1965 (MSJ x 1.3-1.4)  Protein:  90-115 grams (1-1.3 grams/kg)  Fluid:  1.8-2 L/day   EDUCATION NEEDS:   Education needs addressed (Increased calorie and protein needs during exacerbation. Reviewed protein foods with patient and encouraged intake.)  Amanda RimaLeanne Keiarah Orlowski, MS, RD, LDN Pager: [phone] After Hours Pager: [phone]

## 2016-08-06 NOTE — Progress Notes (Signed)
SATURATION QUALIFICATIONS: (This note is used to comply with regulatory documentation for home oxygen)  Patient Saturations on Room Air at Rest = 94-96%   Patient Saturations on Room Air while Ambulating = 77-80%  Patient Saturations on Liters of oxygen while Ambulating =   Please briefly explain why patient needs home oxygen: Patient desats into 70's and 80's while ambulating on RA

## 2016-08-06 NOTE — Evaluation (Signed)
Physical Therapy Evaluation Patient Details Name: Arlys JohnDebra B Hurd MRN: [id number] DOB: [birth year] Today's Date: 08/06/2016   History of Present Illness  65 y.o. female with medical history significant for COPD and tobacco abuse, presented to the emergency department for evaluation of hypoxia  Clinical Impression  Patient evaluated by Physical Therapy with no further acute PT needs identified. All education has been completed and the patient has no further questions.  See below for any follow-up Physical Therapy or equipment needs. PT is signing off. Thank you for this referral.  SATURATION QUALIFICATIONS: (This note is used to comply with regulatory documentation for home oxygen)  Patient Saturations on Room Air at Rest = 86%  Patient Saturations on Room Air while Ambulating = n/a  Patient Saturations on 4 Liters of oxygen while Ambulating = 88%  Please briefly explain why patient needs home oxygen: to maintain oxygen saturations above 88% at rest  (pt wearing acrylic nails however good wave form during during assessment)        Follow Up Recommendations No PT follow up    Equipment Recommendations  None recommended by PT    Recommendations for Other Services       Precautions / Restrictions Precautions Precautions: None      Mobility  Bed Mobility Overal bed mobility: Modified Independent                Transfers Overall transfer level: Modified independent                  Ambulation/Gait Ambulation/Gait assistance: Supervision;Modified independent (Device/Increase time) Ambulation Distance (Feet): 200 Feet Assistive device: None Gait Pattern/deviations: Step-through pattern     General Gait Details: pt with steady gait, denies SOB, required 4L O2 [place] for SPO2 88%  Stairs            Wheelchair Mobility    Modified Rankin (Stroke Patients Only)       Balance Overall balance assessment: No apparent balance deficits (not formally  assessed)                                           Pertinent Vitals/Pain Pain Assessment: No/denies pain  Pt was on 2.5 L O2 [place] in room SpO2 86% at rest on room air SpO2 86-88% on 3L during ambulation, improved to maintain 88% on 4L O2 [place] during ambulation    Home Living Family/patient expects to be discharged to:: Private residence Living Arrangements: Spouse/significant other;Children (daughter)   Type of Home: House       Home Layout: Two level;Able to live on main level with bedroom/bathroom Home Equipment: None      Prior Function           Comments: pt works, reports daughter lives on second level     Hand Dominance        Extremity/Trunk Assessment               Lower Extremity Assessment: Overall WFL for tasks assessed         Communication   Communication: No difficulties  Cognition Arousal/Alertness: Awake/alert Behavior During Therapy: WFL for tasks assessed/performed Overall Cognitive Status: Within Functional Limits for tasks assessed                      General Comments      Exercises     Assessment/Plan  PT Assessment Patent does not need any further PT services  PT Problem List            PT Treatment Interventions      PT Goals (Current goals can be found in the Care Plan section)  Acute Rehab PT Goals PT Goal Formulation: All assessment and education complete, DC therapy    Frequency     Barriers to discharge        Co-evaluation               End of Session Equipment Utilized During Treatment: Oxygen Activity Tolerance: Patient tolerated treatment well Patient left: in bed           Time: [id number] PT Time Calculation (min) (ACUTE ONLY): 12 min   Charges:   PT Evaluation $PT Eval Low Complexity: 1 Procedure     PT G Codes:        Richardson Dubree,KATHrine E 08/06/2016, 11:55 AM Zenovia JarredKati Domanique Luckett, PT, DPT 08/06/2016 Pager: [phone]

## 2016-08-06 NOTE — Progress Notes (Signed)
PROGRESS NOTE  Amanda Barajas  WUJ:[id number] DOB: [birth year] DOA: 08/05/2016 PCP: Jarrett Soho, PA-C   Brief Narrative: Amanda Barajas is a 65 y.o. female with a history of COPD and tobacco use who was sent from urgent care for hypoxemia. She had suffered from worsening cough productive of green sputum and dyspnea increasing even at rest. She sought care at urgent care on 11/11, returning 11/14 after no improvement with azithromycin and prednisone 10mg  daily. SpO2 on reevaluation was 80%, so she was given solumedrol 125mg  and sent to the ED. On arrival she was afebrile, 82% on room air, tachypneic with diminished lung sounds and wheezing, and mildly hypertensive. CXR showed no active cardiopulmonary disease. WBC 13.8k, hgb 16mg /dl, she was given IV fluids, oral potassium for K of 2.9, and a duoneb which provided subjective improvement. She remained hypoxemic, prompting admission for acute respiratory failure due to acute COPD exacerbation.  Assessment & Plan: Principal Problem:   COPD with acute exacerbation (HCC) Active Problems:   Hypokalemia   Hyperglycemia   Acute respiratory failure with hypoxia (HCC)  Acute hypoxemic respiratory failure due to acute COPD exacerbation: Failed outpatient management with azithro/prednisone. No evidence of CHF or pneumonia.  - Continue ICS/LABA, duonebs prn, wean solumedrol quickly to po 11/16.  - Follow up sputum culture - Continue levaquin >> po 11/16  Hypokalemia  - Serum potassium is 2.9 on admission  - Likely secondary to diarrhea, which patient said she gets every time she goes on prednisone burst - She was given 40 mEq oral potassium in ED and 30 mEq IV potassium on admission  - Check chem panel with mag level in am, monitor on telemetry   3. Hyperglycemia  - Serum glucose 280 on admission without hx of DM, likely secondary to steroids, and possibly undiagnosed DM  - Plan to check CBG with meals and qHS; check A1c  - Start a  low-intensity SSI and adjust prn    DVT prophylaxis: Lovenox Code Status: Full Family Communication: No family at bedside this AM Disposition Plan: Discharge to home with home oxygen in next 24 hours.  Consultants:   None  Procedures:   None  Antimicrobials:  Levaquin 11/14 >> 11/20   Subjective: Pt feels marginally better breathing since admission last night. No chest pain, N/V/fever, leg swelling or palpitations.   Objective: Vitals:   08/05/16 2000 08/05/16 2122 08/06/16 0507 08/06/16 1534  BP: 138/79 (!) 158/72 (!) 145/61 (!) 142/70  Pulse: 93 88 88 86  Resp: 25 (!) 22 20 19   Temp:  99.1 F (37.3 C) 98.1 F (36.7 C) 98.1 F (36.7 C)  TempSrc:  Oral Oral Oral  SpO2: 91% 93% 96% 94%  Weight:  87.4 kg (192 lb 9.6 oz) 88.8 kg (195 lb 11.2 oz)   Height:  5\' 2"  (1.575 m)      Intake/Output Summary (Last 24 hours) at 08/06/16 2008 Last data filed at 08/06/16 1844  Gross per 24 hour  Intake             1185 ml  Output                5 ml  Net             1180 ml   Filed Weights   08/05/16 1721 08/05/16 2122 08/06/16 0507  Weight: 87.1 kg (192 lb) 87.4 kg (192 lb 9.6 oz) 88.8 kg (195 lb 11.2 oz)    Examination: General exam: 65 y.o. female in  no distress Respiratory system: Mildly labored breathing 4L by [place] out of breath at the end of sentences. Clear to auscultation bilaterally.  Cardiovascular system: Regular rate and rhythm. No murmur, rub, or gallop. No JVD, and no pedal edema. Gastrointestinal system: Abdomen soft, non-tender, non-distended, with normoactive bowel sounds. No organomegaly or masses felt. Central nervous system: Alert and oriented. No focal neurological deficits. Extremities: Warm, no deformities Skin: No rashes, lesions no ulcers Psychiatry: Judgement and insight appear normal. Mood & affect appropriate.   Data Reviewed: I have personally reviewed following labs and imaging studies  CBC:  Recent Labs Lab 08/05/16 1829  WBC 13.8*    NEUTROABS 9.3*  HGB 16.0*  HCT 48.8*  MCV 93.7  PLT 253   Basic Metabolic Panel:  Recent Labs Lab 08/05/16 1829 08/06/16 0335  NA 141 139  K 2.9* 4.3  CL 95* 97*  CO2 34* 33*  GLUCOSE 280* 382*  BUN 12 15  CREATININE 0.58 0.65  CALCIUM 8.6* 8.0*  MG 1.4* 2.1   GFR: Estimated Creatinine Clearance: 75.5 mL/min (by C-G formula based on SCr of 0.65 mg/dL). Liver Function Tests:  Recent Labs Lab 08/05/16 1829  AST 16  ALT 19  ALKPHOS 92  BILITOT 0.7  PROT 6.6  ALBUMIN 3.6   No results for input(s): LIPASE, AMYLASE in the last 168 hours. No results for input(s): AMMONIA in the last 168 hours. Coagulation Profile: No results for input(s): INR, PROTIME in the last 168 hours. Cardiac Enzymes: No results for input(s): CKTOTAL, CKMB, CKMBINDEX, TROPONINI in the last 168 hours. BNP (last 3 results) No results for input(s): PROBNP in the last 8760 hours. HbA1C: No results for input(s): HGBA1C in the last 72 hours. CBG:  Recent Labs Lab 08/05/16 2333 08/06/16 0227 08/06/16 0743 08/06/16 1430 08/06/16 1634  GLUCAP 517* 372* 287* 366* 341*   Lipid Profile: No results for input(s): CHOL, HDL, LDLCALC, TRIG, CHOLHDL, LDLDIRECT in the last 72 hours. Thyroid Function Tests: No results for input(s): TSH, T4TOTAL, FREET4, T3FREE, THYROIDAB in the last 72 hours. Anemia Panel: No results for input(s): VITAMINB12, FOLATE, FERRITIN, TIBC, IRON, RETICCTPCT in the last 72 hours. Urine analysis: No results found for: COLORURINE, APPEARANCEUR, LABSPEC, PHURINE, GLUCOSEU, HGBUR, BILIRUBINUR, KETONESUR, PROTEINUR, UROBILINOGEN, NITRITE, LEUKOCYTESUR Sepsis Labs: @LABRCNTIP (procalcitonin:4,lacticidven:4)  )No results found for this or any previous visit (from the past 240 hour(s)).   Radiology Studies: Dg Chest 2 View  Result Date: 08/05/2016 CLINICAL DATA:  Possible pneumonia, recent completion of the Z-Pak , still feeling unwell. Productive cough. EXAM: CHEST  2 VIEW  COMPARISON:  None. FINDINGS: Cardiac silhouette is upper limits of normal, mediastinal silhouette is nonsuspicious, mildly calcified aortic knob. No pleural effusion or focal consolidation. No pneumothorax. Soft tissue planes and included osseous structures are nonsuspicious. Severe cervical facet arthropathy. IMPRESSION: Borderline cardiomegaly, no acute pulmonary process. Electronically Signed   By: Awilda Metroourtnay  Bloomer M.D.   On: 08/05/2016 19:02    Scheduled Meds: . cyanocobalamin  500 mcg Oral Daily  . enoxaparin (LOVENOX) injection  40 mg Subcutaneous Q24H  . insulin aspart  0-5 Units Subcutaneous QHS  . insulin aspart  0-9 Units Subcutaneous TID WC  . levofloxacin (LEVAQUIN) IV  750 mg Intravenous Q24H  . methylPREDNISolone (SOLU-MEDROL) injection  60 mg Intravenous Q8H  . mometasone-formoterol  2 puff Inhalation BID  . multivitamin with minerals  1 tablet Oral Daily  . nicotine  21 mg Transdermal Daily  . sodium chloride flush  3 mL Intravenous Q12H  .  sodium chloride flush  3 mL Intravenous Q12H   Continuous Infusions:   LOS: 1 day   Time spent: 25 minutes.  Hazeline Junkeryan Athene Schuhmacher, MD [facility] Pager [phone]  If 7PM-7AM, please contact night-coverage [email or web address] Password [email or web address] 08/06/2016, 8:08 PM

## 2016-08-07 LAB — HEMOGLOBIN A1C
HEMOGLOBIN A1C: 10.6 % — AB (ref 4.8–5.6)
Mean Plasma Glucose: 258 mg/dL

## 2016-08-07 LAB — GLUCOSE, CAPILLARY
GLUCOSE-CAPILLARY: 376 mg/dL — AB (ref 65–99)
Glucose-Capillary: 387 mg/dL — ABNORMAL HIGH (ref 65–99)

## 2016-08-07 MED ORDER — PREDNISONE 20 MG PO TABS: 40.0000 mg | ORAL_TABLET | Freq: Every day | ORAL | 0 refills | Status: AC

## 2016-08-07 MED ORDER — INSULIN GLARGINE 100 UNIT/ML SOLOSTAR PEN: 15.0000 [IU] | PEN_INJECTOR | Freq: Every day | SUBCUTANEOUS | 0 refills | Status: AC

## 2016-08-07 MED ORDER — INSULIN STARTER KIT- PEN NEEDLES (ENGLISH): 1.0000 | Freq: Once | 0 refills | Status: AC

## 2016-08-07 MED ORDER — METFORMIN HCL ER 500 MG PO TB24
500.0000 mg | ORAL_TABLET | Freq: Every day | ORAL | Status: DC
  Filled 2016-08-07: qty 1

## 2016-08-07 MED ORDER — METFORMIN HCL ER 500 MG PO TB24: 500.0000 mg | ORAL_TABLET | Freq: Every day | ORAL | 0 refills | Status: AC

## 2016-08-07 MED ORDER — LEVOFLOXACIN 750 MG PO TABS: 750.0000 mg | ORAL_TABLET | Freq: Every day | ORAL | 0 refills | Status: AC

## 2016-08-07 MED ORDER — PREDNISONE 20 MG PO TABS: 40.0000 mg | ORAL_TABLET | Freq: Every day | ORAL | 0 refills | Status: DC

## 2016-08-07 MED ORDER — INSULIN STARTER KIT- PEN NEEDLES (ENGLISH): 1.0000 | Freq: Once | 0 refills | Status: DC

## 2016-08-07 MED ORDER — INSULIN STARTER KIT- PEN NEEDLES (ENGLISH)
1.0000 | Freq: Once | Status: DC
  Filled 2016-08-07: qty 1

## 2016-08-07 MED ORDER — INSULIN PEN NEEDLE 31G X 5 MM MISC: 0 refills | Status: DC

## 2016-08-07 MED ORDER — LEVOFLOXACIN 750 MG PO TABS: 750.0000 mg | ORAL_TABLET | Freq: Every day | ORAL | 0 refills | Status: DC

## 2016-08-07 MED ORDER — METHYLPREDNISOLONE SODIUM SUCC 125 MG IJ SOLR: 60.0000 mg | Freq: Two times a day (BID) | INTRAMUSCULAR | Status: DC

## 2016-08-07 MED ORDER — INSULIN PEN NEEDLE 31G X 5 MM MISC: 0 refills | Status: AC

## 2016-08-07 MED ORDER — BLOOD GLUCOSE METER KIT: PACK | 0 refills | Status: AC

## 2016-08-07 MED ORDER — INSULIN GLARGINE 100 UNIT/ML SOLOSTAR PEN: 15.0000 [IU] | PEN_INJECTOR | Freq: Every day | SUBCUTANEOUS | 0 refills | Status: DC

## 2016-08-07 MED ORDER — LIVING WELL WITH DIABETES BOOK
Freq: Once | Status: DC
  Filled 2016-08-07: qty 1

## 2016-08-07 NOTE — Progress Notes (Signed)
Inpatient Diabetes Program Recommendations  AACE/ADA: New Consensus Statement on Inpatient Glycemic Control (2015)  Target Ranges:  Prepandial:   less than 140 mg/dL      Peak postprandial:   less than 180 mg/dL (1-2 hours)      Critically ill patients:  140 - 180 mg/dL   Lab Results  Component Value Date   GLUCAP 387 (H) 08/07/2016   HGBA1C 10.6 (H) 08/06/2016    Review of Glycemic Control  Diabetes history: Newly-diagnosed Outpatient Diabetes medications: None Current orders for Inpatient glycemic control: Novolog sensitive tidwc and hs  Spoke with patient about new diabetes diagnosis.  Discussed A1C results (10.6%) and explained what an A1C is and informed patient that his current A1C indicates an average glucose of 240 mg/dl over the past 2-3 months. Discussed basic pathophysiology of DM Type 2, basic home care, importance of checking CBGs and maintaining good CBG control to prevent long-term and short-term complications. Reviewed glucose and A1C goals and explained that patient will need to continue to  Reviewed signs and symptoms of hyperglycemia and hypoglycemia along with treatment for both. Discussed impact of nutrition, exercise, stress, sickness, and medications on diabetes control. Reviewed Living Well with diabetes booklet and encouraged patient to read through entire book.  Asked patient to check his glucose 4 times per day (before meals and at bedtime) and to keep a log book of glucose readings and insulin taken. Explained how the doctor he follows up with can use the log book to continue to make insulin adjustments if needed. Reviewed and demonstrated how to use insulin pen.    Pt to f/u with PCP for diabetes management. Answered questions. Pt confirms understanding.  Recommendations: Lantus 15 units QHS Metformin 500 mg QD, if tolerating, increase to bid. OP Diabetes Education for newly diagnosed DM. (Ordered)  Discussed with MD.  Thank you. Ailene Ardshonda Akhilesh Sassone, RD, LDN,  CDE Inpatient Diabetes Coordinator [phone]

## 2016-08-07 NOTE — Discharge Summary (Signed)
Physician Discharge Summary  Amanda Barajas QJF:[id number] DOB: [birth year] DOA: 08/05/2016  PCP: Marda Stalker, PA-C  Admit date: 08/05/2016 Discharge date: 08/07/2016  Admitted From: Home Disposition: Home   Recommendations for Outpatient Follow-up:  1. Follow up with PCP in 1 week to discuss new diagnosis of diabetes (HbA1c 10.6%) 1. Adjust insulin based on [facility] records which patient was instructed to keep. Started lantus solostar pen 15u qHS. 2. Patient is to be contacted for follow up in outpatient diabetes education clinic. 3. Metformin started prior to discharge, titrate as able. 4. Please obtain fasting lipid panel and start statin as indicated. 5. ACE inhibitor not started during admission. 2. Monitor respiratory status: Discharged with home oxygen, though suspect this will be temporary.   Home Health: None recommended Equipment/Devices: 2L O2  Discharge Condition: Stable CODE STATUS: Full Diet recommendation: Carbohydrate limited  Brief/Interim Summary: Amanda Barajas is a 65 y.o. female with a history of COPD and tobacco use who was sent from urgent care for hypoxemia. She had suffered from worsening cough productive of green sputum and dyspnea increasing even at rest. She sought care at urgent care on 11/11, returning 11/14 after no improvement with azithromycin and prednisone 46m daily. SpO2 on reevaluation was 80%, so she was given solumedrol 125mand sent to the ED. On arrival she was afebrile, 82% on room air, tachypneic with diminished lung sounds and wheezing, and mildly hypertensive. CXR showed no active cardiopulmonary disease. WBC 13.8k, hgb 167ml, she was given IV fluids, oral potassium for K of 2.9, and a duoneb which provided subjective improvement. She remained hypoxemic, prompting admission for acute respiratory failure due to acute COPD exacerbation. Her respiratory status showed slow improvement with nebulizers, higher doses of steroids, and levaquin. She  continues to have an oxygen requirement with exertion (new for her). Though this may only be temporary, she was tolerating po medications and strongly desired to go home. She will be discharged with PCP follow up.   She was diagnosed with diabetes, given education, and started on insulin and metformin. This will require ongoing care.   Discharge Diagnoses:  Principal Problem:   COPD with acute exacerbation (HCCBondvillective Problems:   Hypokalemia   Hyperglycemia   Acute respiratory failure with hypoxia (HCC)  Acute hypoxemic respiratory failure due to acute COPD exacerbation: Failed outpatient management with azithro/prednisone. No evidence of CHF or pneumonia.  - Continue ICS/LABA, duonebs prn, weaned solumedrol quickly to prednisone 34m66mily to complete 5 days (not counting doses of 10mg58mor to admission) - Follow up sputum culture (NGTD) - Continue levaquin; kept on IV a day longer than typical due to failure of outpatient management and new oxygen requirement, but tolerated po dose on day of discharge.   Tobacco use: Cessation counseling provided.  Hypokalemia: Resolved - Serum potassium is 2.9 on admission  - Likely secondary to diarrhea, which patient said she gets every time she goes on prednisone burst - She was given 40 mEq oral potassium in ED and 30 mEq IV potassium on admission   New diagnosis type II diabetes mellitus: HbA1c 10.6%.  - Serum glucose 280 on admission without hx of DM, likely secondary to steroids, diagnosis confirmed with A1c - Started lantus 15units and metformin 500mg 88my - Plan to check CBG with meals and qHS, record this and bring to PCP follow up appointment.  Discharge Instructions Discharge Instructions    Ambulatory referral to Nutrition and Diabetic Education    Complete by:  As directed  Call MD for:  difficulty breathing, headache or visual disturbances    Complete by:  As directed    Call MD for:  extreme fatigue    Complete by:  As  directed    Call MD for:  persistant dizziness or light-headedness    Complete by:  As directed    Call MD for:  persistant nausea and vomiting    Complete by:  As directed    Diet - low sodium heart healthy    Complete by:  As directed    Discharge instructions    Complete by:  As directed    You were admitted for a COPD exacerbation which has improved with steroids and antibiotics, which will be continued for 3 days after discharge.  - Continue levaquin 722m by mouth daily and prednisone 470mby mouth daily until out of medication.  You qualified for home oxygen, so you should continue this until you follow up with your PCP. - Follow up with your PCP within the next week.  You were diagnosed with diabetes during this hospitalization. Your Hemoglobin A1c is 10.6%. Some of the high blood sugars are from steroids, but it is important that you continue to take lantus 15 units nightly and check your blood sugars when fasting in the morning and before meals. Record these values and take this to your PCP within the next week.   Increase activity slowly    Complete by:  As directed        Medication List    STOP taking these medications   azithromycin 250 MG tablet Commonly known as:  ZITHROMAX     TAKE these medications   albuterol 108 (90 Base) MCG/ACT inhaler Commonly known as:  PROVENTIL HFA;VENTOLIN HFA Inhale 1-2 puffs into the lungs every 4 (four) hours as needed for wheezing or shortness of breath.   albuterol (2.5 MG/3ML) 0.083% nebulizer solution Commonly known as:  PROVENTIL Take 2.5 mg by nebulization every 4 (four) hours as needed for wheezing or shortness of breath.   blood glucose meter kit and supplies Dispense based on patient and insurance preference. Use four times daily as directed. (FOR ICD-9 250.00, 250.01).   budesonide-formoterol 80-4.5 MCG/ACT inhaler Commonly known as:  SYMBICORT Inhale 2 puffs into the lungs 2 (two) times daily.   COMBIVENT RESPIMAT  20-100 MCG/ACT Aers respimat Generic drug:  Ipratropium-Albuterol Inhale 1 puff into the lungs 4 (four) times daily.   diphenhydramine-acetaminophen 25-500 MG Tabs tablet Commonly known as:  TYLENOL PM Take 2 tablets by mouth at bedtime.   fluticasone 50 MCG/ACT nasal spray Commonly known as:  FLONASE Place 1 spray into both nostrils daily as needed for allergies or rhinitis.   ibuprofen 200 MG tablet Commonly known as:  ADVIL,MOTRIN Take 400 mg by mouth every 6 (six) hours as needed for fever, headache, mild pain, moderate pain or cramping.   Insulin Glargine 100 UNIT/ML Solostar Pen Commonly known as:  LANTUS Inject 15 Units into the skin daily at 10 pm.   Insulin Pen Needle 31G X 5 MM Misc BD Pen Needles- brand specific Inject insulin via insulin pen 6 x daily   insulin starter kit- pen needles Misc 1 kit by Other route once.   levofloxacin 750 MG tablet Commonly known as:  LEVAQUIN Take 1 tablet (750 mg total) by mouth daily.   metFORMIN 500 MG 24 hr tablet Commonly known as:  GLUCOPHAGE-XR Take 1 tablet (500 mg total) by mouth daily with breakfast.   multivitamin  with minerals Tabs tablet Take 1 tablet by mouth daily.   predniSONE 20 MG tablet Commonly known as:  DELTASONE Take 2 tablets (40 mg total) by mouth daily. What changed:  how much to take  additional instructions   vitamin B-12 500 MCG tablet Commonly known as:  CYANOCOBALAMIN Take 500 mcg by mouth daily.            Durable Medical Equipment        Start     Ordered   08/07/16 1426  For home use only DME oxygen  Once    Question Answer Comment  Mode or (Route) Nasal cannula   Liters per Minute 2   Frequency Continuous (stationary and portable oxygen unit needed)   Oxygen delivery system Gas      08/07/16 1426     Follow-up Information    Marda Stalker, PA-C. Schedule an appointment as soon as possible for a visit in 1 week(s).   Specialty:  Family Medicine Contact  information: [place] [address] [phone]          Allergies  Allergen Reactions  . Penicillins Hives    Has patient had a PCN reaction causing immediate rash, facial/tongue/throat swelling, SOB or lightheadedness with hypotension: Yes Has patient had a PCN reaction causing severe rash involving mucus membranes or skin necrosis: Yes Has patient had a PCN reaction that required hospitalization No Has patient had a PCN reaction occurring within the last 10 years: No  If all of the above answers are "NO", then may proceed with Cephalosporin use.   . Eggs Or Egg-Derived Products Diarrhea and Nausea And Vomiting   Consultations:  Diabetes coordinator  Procedures/Studies: Dg Chest 2 View  Result Date: 08/05/2016 CLINICAL DATA:  Possible pneumonia, recent completion of the Z-Pak , still feeling unwell. Productive cough. EXAM: CHEST  2 VIEW COMPARISON:  None. FINDINGS: Cardiac silhouette is upper limits of normal, mediastinal silhouette is nonsuspicious, mildly calcified aortic knob. No pleural effusion or focal consolidation. No pneumothorax. Soft tissue planes and included osseous structures are nonsuspicious. Severe cervical facet arthropathy. IMPRESSION: Borderline cardiomegaly, no acute pulmonary process. Electronically Signed   By: Elon Alas M.D.   On: 08/05/2016 19:02    Subjective: Pt feels much better, breathing more near to baseline. Ambulated hall twice comfortably but desaturated after 2nd trip. No chest pain, abd pain, N/V/D, no dysuria. Wants to go home.   Discharge Exam: Vitals:   08/07/16 0432 08/07/16 1400  BP: (!) 147/72 (!) 142/76  Pulse: 76 72  Resp: 18 18  Temp: 98.1 F (36.7 C) 98.6 F (37 C)   Vitals:   08/06/16 2151 08/07/16 0432 08/07/16 0838 08/07/16 1400  BP: (!) 157/76 (!) 147/72  (!) 142/76  Pulse: 91 76  72  Resp: 20 18  18   Temp: 98.2 F (36.8 C) 98.1 F (36.7 C)  98.6 F (37 C)  TempSrc: Oral Oral  Oral   SpO2: 93% 95% 94% 95%  Weight:  89.7 kg (197 lb 12.8 oz)    Height:       General: Pt is alert, awake, not in acute distress Cardiovascular: RRR, S1/S2 +, no rubs, no gallops Respiratory: Nonlabored, CTA bilaterally, no wheezing, no crackles.  Abdominal: Soft, NT, ND, bowel sounds + Extremities: no edema, no cyanosis  The results of significant diagnostics from this hospitalization (including imaging, microbiology, ancillary and laboratory) are listed below for reference.    Labs: BNP (last 3 results)  Recent  Labs  08/05/16 1829  BNP 44.6   Basic Metabolic Panel:  Recent Labs Lab 08/05/16 1829 08/06/16 0335  NA 141 139  K 2.9* 4.3  CL 95* 97*  CO2 34* 33*  GLUCOSE 280* 382*  BUN 12 15  CREATININE 0.58 0.65  CALCIUM 8.6* 8.0*  MG 1.4* 2.1   Liver Function Tests:  Recent Labs Lab 08/05/16 1829  AST 16  ALT 19  ALKPHOS 92  BILITOT 0.7  PROT 6.6  ALBUMIN 3.6   CBC:  Recent Labs Lab 08/05/16 1829  WBC 13.8*  NEUTROABS 9.3*  HGB 16.0*  HCT 48.8*  MCV 93.7  PLT 253   CBG:  Recent Labs Lab 08/06/16 1430 08/06/16 1634 08/06/16 2148 08/07/16 0814 08/07/16 1232  GLUCAP 366* 341* 420* 387* 376*   Hgb A1c  Recent Labs  08/06/16 0335  HGBA1C 10.6*   Time coordinating discharge: Over 30 minutes  Vance Gather, MD  [facility] 08/07/2016, 2:45 PM Pager [phone]  If 7PM-7AM, please contact night-coverage [email or web address] Password [email or web address]

## 2016-08-12 DIAGNOSIS — Z7984 Long term (current) use of oral hypoglycemic drugs: Secondary | ICD-10-CM | POA: Diagnosis not present

## 2016-08-12 DIAGNOSIS — J449 Chronic obstructive pulmonary disease, unspecified: Secondary | ICD-10-CM | POA: Diagnosis not present

## 2016-08-12 DIAGNOSIS — E119 Type 2 diabetes mellitus without complications: Secondary | ICD-10-CM | POA: Diagnosis not present

## 2016-08-12 DIAGNOSIS — D72829 Elevated white blood cell count, unspecified: Secondary | ICD-10-CM | POA: Diagnosis not present

## 2016-08-12 DIAGNOSIS — E876 Hypokalemia: Secondary | ICD-10-CM | POA: Diagnosis not present

## 2016-09-06 DIAGNOSIS — J441 Chronic obstructive pulmonary disease with (acute) exacerbation: Secondary | ICD-10-CM | POA: Diagnosis not present

## 2016-09-08 ENCOUNTER — Institutional Professional Consult (permissible substitution): Payer: BLUE CROSS/BLUE SHIELD | Admitting: Internal Medicine

## 2016-09-26 ENCOUNTER — Institutional Professional Consult (permissible substitution): Payer: BLUE CROSS/BLUE SHIELD | Admitting: Internal Medicine

## 2016-10-03 DIAGNOSIS — E119 Type 2 diabetes mellitus without complications: Secondary | ICD-10-CM | POA: Diagnosis not present

## 2016-10-03 DIAGNOSIS — I10 Essential (primary) hypertension: Secondary | ICD-10-CM | POA: Diagnosis not present

## 2016-11-28 DIAGNOSIS — E119 Type 2 diabetes mellitus without complications: Secondary | ICD-10-CM | POA: Diagnosis not present

## 2016-11-28 DIAGNOSIS — I10 Essential (primary) hypertension: Secondary | ICD-10-CM | POA: Diagnosis not present

## 2017-01-14 DIAGNOSIS — R21 Rash and other nonspecific skin eruption: Secondary | ICD-10-CM | POA: Diagnosis not present

## 2017-01-14 DIAGNOSIS — I10 Essential (primary) hypertension: Secondary | ICD-10-CM | POA: Diagnosis not present

## 2017-05-06 IMAGING — CR DG CHEST 2V
2 series · 2 of 2 positions shown · non-contrast
Comparison: None.

CLINICAL DATA: Possible pneumonia, recent completion of the [place] ,
still feeling unwell. Productive cough.

EXAM:
CHEST  2 VIEW

[w chest pa]
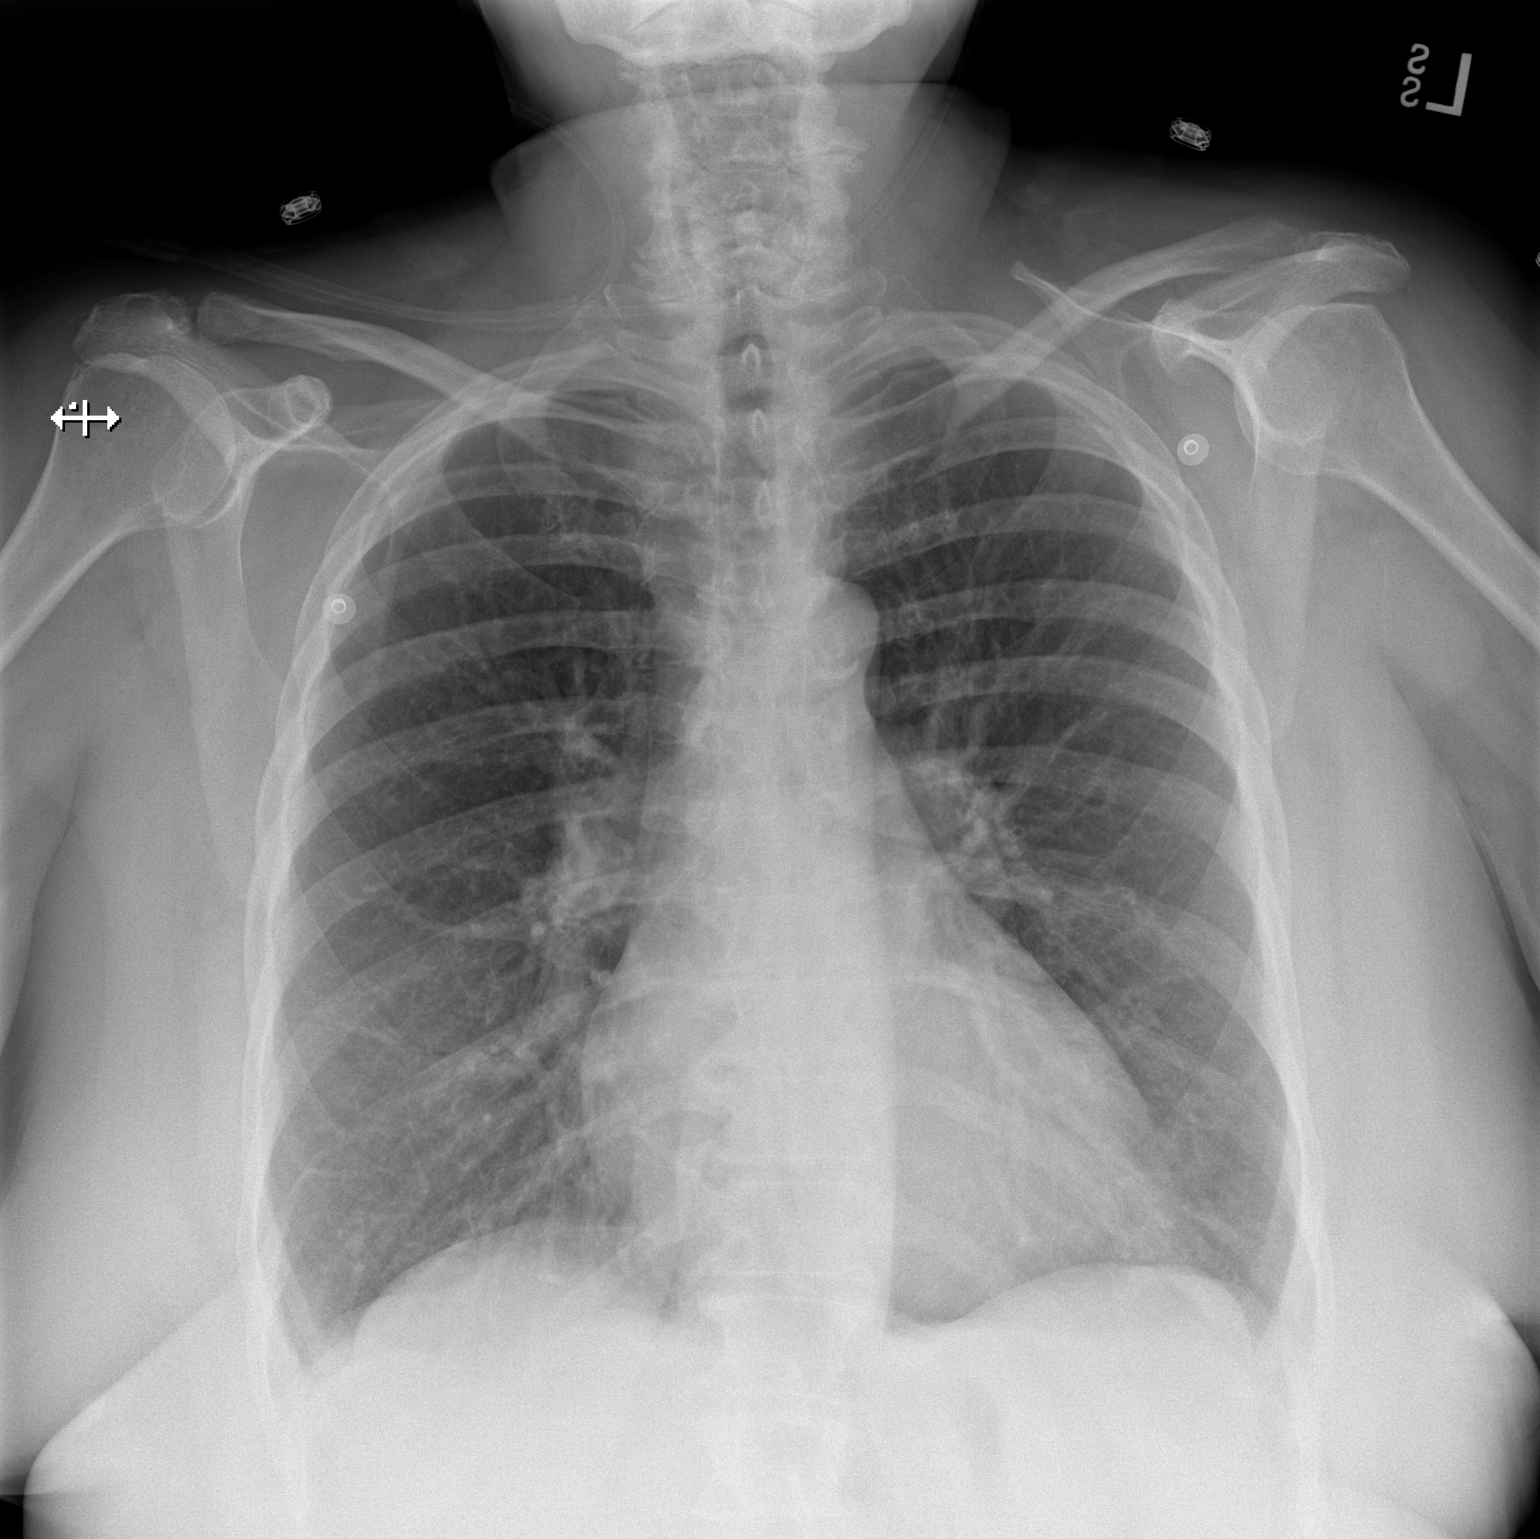

[w chest lat]
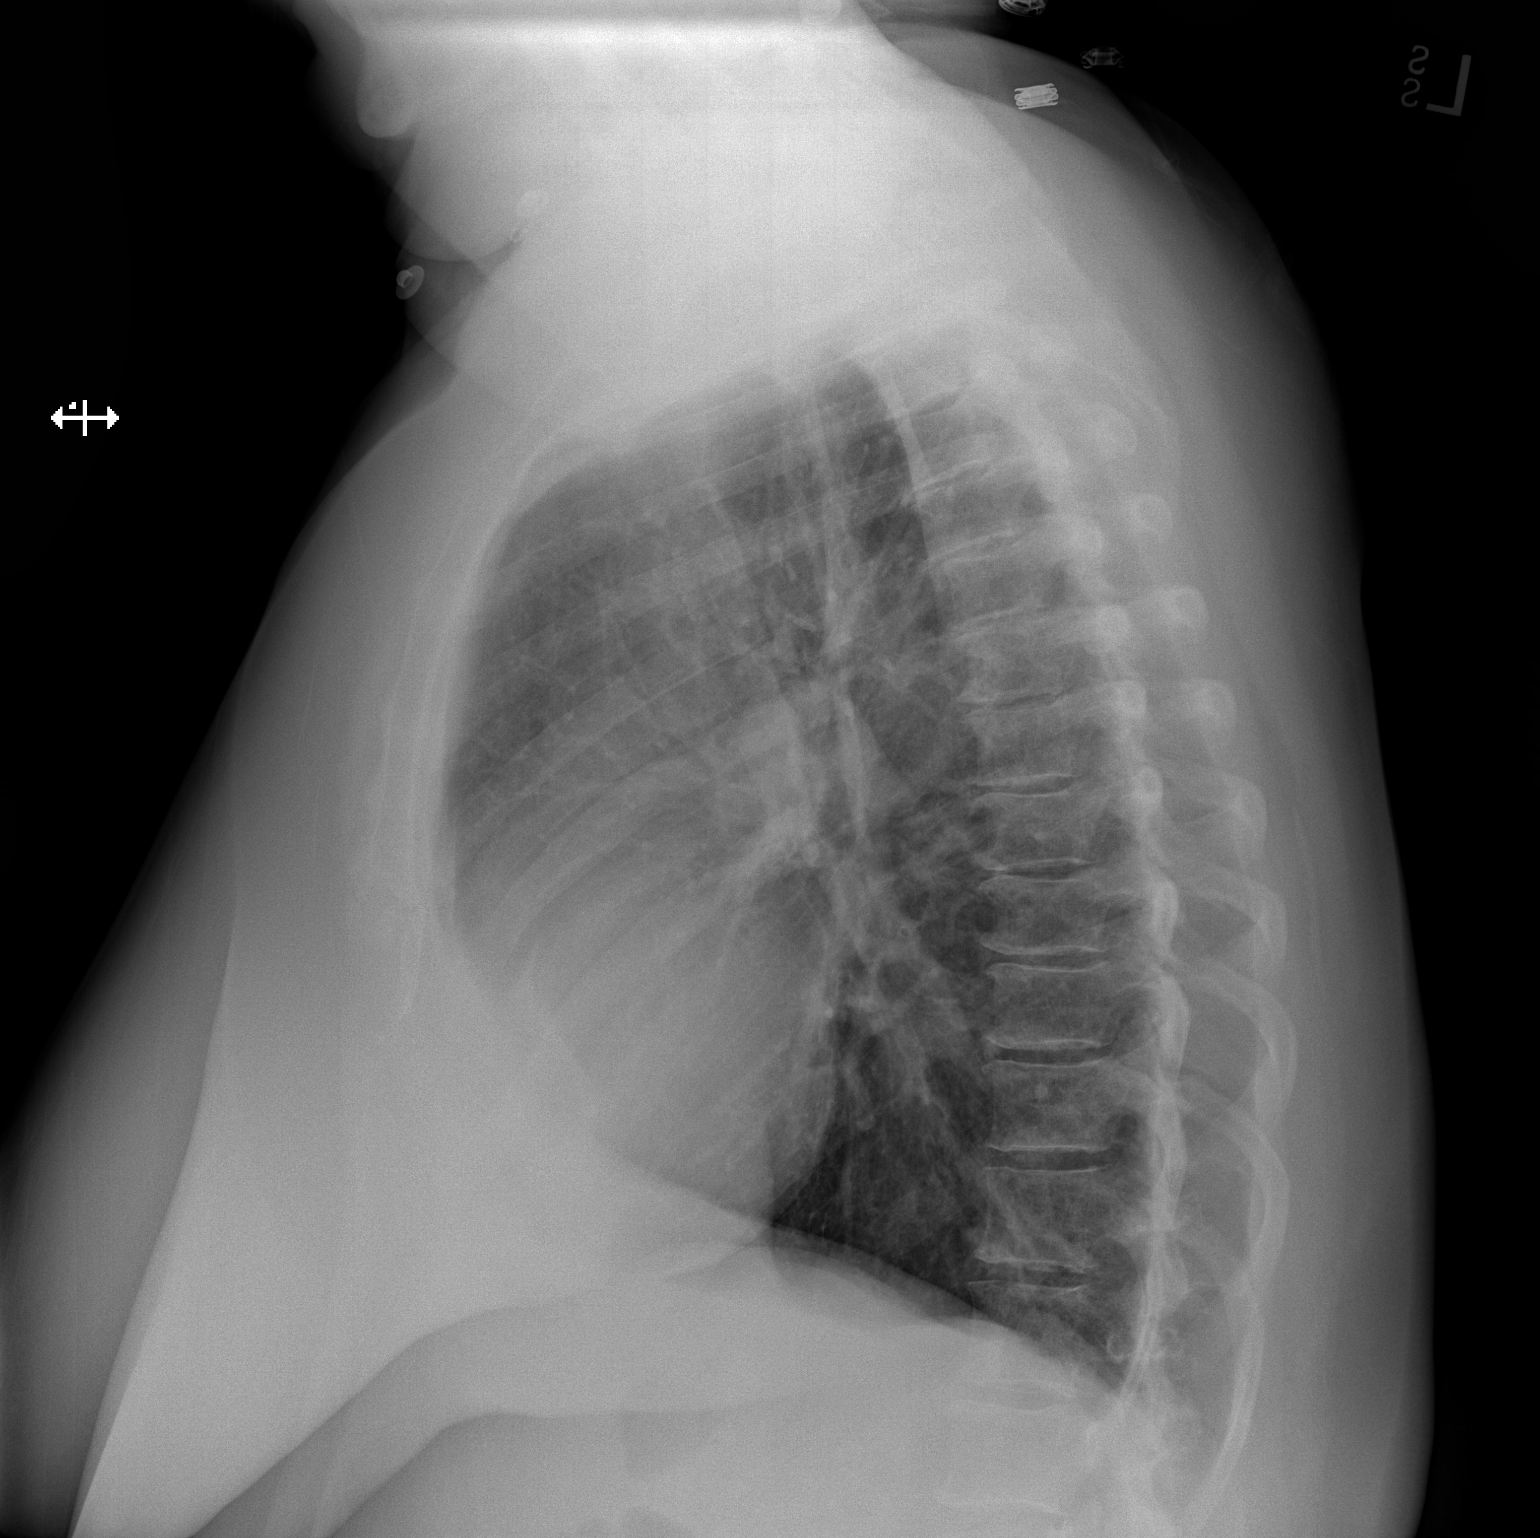

[2 of 2 positions shown; findings below may reference images not displayed]

FINDINGS: Cardiac silhouette is upper limits of normal, mediastinal silhouette
is nonsuspicious, mildly calcified aortic knob. No pleural effusion
or focal consolidation. No pneumothorax. Soft tissue planes and
included osseous structures are nonsuspicious. Severe cervical facet
arthropathy.
IMPRESSION: Borderline cardiomegaly, no acute pulmonary process.

## 2017-10-26 DIAGNOSIS — J209 Acute bronchitis, unspecified: Secondary | ICD-10-CM | POA: Diagnosis not present

## 2018-04-16 DIAGNOSIS — E119 Type 2 diabetes mellitus without complications: Secondary | ICD-10-CM | POA: Diagnosis not present

## 2018-04-16 DIAGNOSIS — I10 Essential (primary) hypertension: Secondary | ICD-10-CM | POA: Diagnosis not present

## 2018-04-16 DIAGNOSIS — E785 Hyperlipidemia, unspecified: Secondary | ICD-10-CM | POA: Diagnosis not present

## 2018-04-16 DIAGNOSIS — J449 Chronic obstructive pulmonary disease, unspecified: Secondary | ICD-10-CM | POA: Diagnosis not present

## 2018-05-09 ENCOUNTER — Other Ambulatory Visit: Payer: Self-pay

## 2018-05-09 ENCOUNTER — Encounter (HOSPITAL_COMMUNITY): Payer: Self-pay

## 2018-05-09 ENCOUNTER — Inpatient Hospital Stay (HOSPITAL_COMMUNITY)
Admission: EM | Admit: 2018-05-09 | Discharge: 2018-05-13 | DRG: 193 | Disposition: A | Discharge status: Home / Self Care | Payer: BLUE CROSS/BLUE SHIELD | Attending: Internal Medicine | Admitting: Internal Medicine

## 2018-05-09 ENCOUNTER — Emergency Department (HOSPITAL_COMMUNITY): Payer: BLUE CROSS/BLUE SHIELD

## 2018-05-09 DIAGNOSIS — E876 Hypokalemia: Secondary | ICD-10-CM | POA: Diagnosis not present

## 2018-05-09 DIAGNOSIS — Z7951 Long term (current) use of inhaled steroids: Secondary | ICD-10-CM | POA: Diagnosis not present

## 2018-05-09 DIAGNOSIS — J449 Chronic obstructive pulmonary disease, unspecified: Secondary | ICD-10-CM | POA: Diagnosis not present

## 2018-05-09 DIAGNOSIS — J440 Chronic obstructive pulmonary disease with acute lower respiratory infection: Secondary | ICD-10-CM | POA: Diagnosis present

## 2018-05-09 DIAGNOSIS — T380X5A Adverse effect of glucocorticoids and synthetic analogues, initial encounter: Secondary | ICD-10-CM | POA: Diagnosis present

## 2018-05-09 DIAGNOSIS — F17210 Nicotine dependence, cigarettes, uncomplicated: Secondary | ICD-10-CM | POA: Diagnosis not present

## 2018-05-09 DIAGNOSIS — J181 Lobar pneumonia, unspecified organism: Secondary | ICD-10-CM | POA: Diagnosis not present

## 2018-05-09 DIAGNOSIS — I10 Essential (primary) hypertension: Secondary | ICD-10-CM | POA: Diagnosis not present

## 2018-05-09 DIAGNOSIS — R0902 Hypoxemia: Secondary | ICD-10-CM | POA: Diagnosis not present

## 2018-05-09 DIAGNOSIS — E119 Type 2 diabetes mellitus without complications: Secondary | ICD-10-CM | POA: Diagnosis not present

## 2018-05-09 DIAGNOSIS — J9600 Acute respiratory failure, unspecified whether with hypoxia or hypercapnia: Secondary | ICD-10-CM | POA: Diagnosis not present

## 2018-05-09 DIAGNOSIS — J441 Chronic obstructive pulmonary disease with (acute) exacerbation: Secondary | ICD-10-CM | POA: Diagnosis not present

## 2018-05-09 DIAGNOSIS — Z79899 Other long term (current) drug therapy: Secondary | ICD-10-CM | POA: Diagnosis not present

## 2018-05-09 DIAGNOSIS — R0602 Shortness of breath: Secondary | ICD-10-CM | POA: Diagnosis not present

## 2018-05-09 DIAGNOSIS — R739 Hyperglycemia, unspecified: Secondary | ICD-10-CM | POA: Diagnosis not present

## 2018-05-09 DIAGNOSIS — J9811 Atelectasis: Secondary | ICD-10-CM | POA: Diagnosis present

## 2018-05-09 DIAGNOSIS — E1165 Type 2 diabetes mellitus with hyperglycemia: Secondary | ICD-10-CM | POA: Diagnosis not present

## 2018-05-09 DIAGNOSIS — Z880 Allergy status to penicillin: Secondary | ICD-10-CM | POA: Diagnosis not present

## 2018-05-09 DIAGNOSIS — E872 Acidosis: Secondary | ICD-10-CM | POA: Diagnosis present

## 2018-05-09 DIAGNOSIS — Z794 Long term (current) use of insulin: Secondary | ICD-10-CM

## 2018-05-09 DIAGNOSIS — R062 Wheezing: Secondary | ICD-10-CM | POA: Diagnosis not present

## 2018-05-09 DIAGNOSIS — J80 Acute respiratory distress syndrome: Secondary | ICD-10-CM | POA: Diagnosis not present

## 2018-05-09 DIAGNOSIS — J189 Pneumonia, unspecified organism: Secondary | ICD-10-CM | POA: Diagnosis not present

## 2018-05-09 DIAGNOSIS — J9601 Acute respiratory failure with hypoxia: Secondary | ICD-10-CM | POA: Diagnosis present

## 2018-05-09 LAB — URINALYSIS, ROUTINE W REFLEX MICROSCOPIC
Bilirubin Urine: NEGATIVE
Ketones, ur: 20 mg/dL — AB
Leukocytes, UA: NEGATIVE
NITRITE: NEGATIVE
Specific Gravity, Urine: 1.018 (ref 1.005–1.030)
pH: 5 (ref 5.0–8.0)

## 2018-05-09 LAB — CBC
HCT: 37.3 % (ref 36.0–46.0)
HEMOGLOBIN: 11.9 g/dL — AB (ref 12.0–15.0)
MCH: 29.9 pg (ref 26.0–34.0)
MCHC: 31.9 g/dL (ref 30.0–36.0)
MCV: 93.7 fL (ref 78.0–100.0)
PLATELETS: 266 10*3/uL (ref 150–400)
RBC: 3.98 MIL/uL (ref 3.87–5.11)
RDW: 13.5 % (ref 11.5–15.5)
WBC: 17.9 10*3/uL — ABNORMAL HIGH (ref 4.0–10.5)

## 2018-05-09 LAB — COMPREHENSIVE METABOLIC PANEL
ALBUMIN: 3 g/dL — AB (ref 3.5–5.0)
ALK PHOS: 133 U/L — AB (ref 38–126)
ALT: 77 U/L — AB (ref 0–44)
AST: 39 U/L (ref 15–41)
Anion gap: 16 — ABNORMAL HIGH (ref 5–15)
BILIRUBIN TOTAL: 0.9 mg/dL (ref 0.3–1.2)
BUN: 9 mg/dL (ref 8–23)
CALCIUM: 8.6 mg/dL — AB (ref 8.9–10.3)
CO2: 27 mmol/L (ref 22–32)
CREATININE: 0.83 mg/dL (ref 0.44–1.00)
Chloride: 95 mmol/L — ABNORMAL LOW (ref 98–111)
GFR calc Af Amer: >60 mL/min (ref >60)
GLUCOSE: 281 mg/dL — AB (ref 70–99)
POTASSIUM: 3.2 mmol/L — AB (ref 3.5–5.1)
Sodium: 138 mmol/L (ref 135–145)
Total Protein: 7.5 g/dL (ref 6.5–8.1)

## 2018-05-09 LAB — CREATININE, SERUM
CREATININE: 0.87 mg/dL (ref 0.44–1.00)
GFR calc Af Amer: >60 mL/min (ref >60)

## 2018-05-09 LAB — CBC WITH DIFFERENTIAL/PLATELET
ABS IMMATURE GRANULOCYTES: 0.1 10*3/uL (ref 0.0–0.1)
BASOS ABS: 0.1 10*3/uL (ref 0.0–0.1)
BASOS PCT: 1 %
Eosinophils Absolute: 0.1 10*3/uL (ref 0.0–0.7)
Eosinophils Relative: 1 %
HCT: 38.7 % (ref 36.0–46.0)
Hemoglobin: 12.3 g/dL (ref 12.0–15.0)
Immature Granulocytes: 1 %
Lymphocytes Relative: 7 %
Lymphs Abs: 1.4 10*3/uL (ref 0.7–4.0)
MCH: 29.5 pg (ref 26.0–34.0)
MCHC: 31.8 g/dL (ref 30.0–36.0)
MCV: 92.8 fL (ref 78.0–100.0)
Monocytes Absolute: 0.7 10*3/uL (ref 0.1–1.0)
Monocytes Relative: 4 %
NEUTROS ABS: 16.8 10*3/uL — AB (ref 1.7–7.7)
NEUTROS PCT: 88 %
PLATELETS: 318 10*3/uL (ref 150–400)
RBC: 4.17 MIL/uL (ref 3.87–5.11)
RDW: 13.5 % (ref 11.5–15.5)
WBC: 19.2 10*3/uL — ABNORMAL HIGH (ref 4.0–10.5)

## 2018-05-09 LAB — I-STAT CHEM 8, ED
BUN: 11 mg/dL (ref 8–23)
CALCIUM ION: 0.98 mmol/L — AB (ref 1.15–1.40)
CHLORIDE: 95 mmol/L — AB (ref 98–111)
Creatinine, Ser: 0.6 mg/dL (ref 0.44–1.00)
Glucose, Bld: 289 mg/dL — ABNORMAL HIGH (ref 70–99)
HCT: 39 % (ref 36.0–46.0)
Hemoglobin: 13.3 g/dL (ref 12.0–15.0)
Potassium: 3.2 mmol/L — ABNORMAL LOW (ref 3.5–5.1)
SODIUM: 136 mmol/L (ref 135–145)
TCO2: 28 mmol/L (ref 22–32)

## 2018-05-09 LAB — MRSA PCR SCREENING: MRSA by PCR: NEGATIVE

## 2018-05-09 LAB — I-STAT TROPONIN, ED: Troponin i, poc: 0 ng/mL (ref 0.00–0.08)

## 2018-05-09 LAB — PROTIME-INR
INR: 1.09
PROTHROMBIN TIME: 14 s (ref 11.4–15.2)

## 2018-05-09 LAB — GLUCOSE, CAPILLARY: GLUCOSE-CAPILLARY: 340 mg/dL — AB (ref 70–99)

## 2018-05-09 LAB — I-STAT CG4 LACTIC ACID, ED: Lactic Acid, Venous: 2.08 mmol/L (ref 0.5–1.9)

## 2018-05-09 LAB — BRAIN NATRIURETIC PEPTIDE: B NATRIURETIC PEPTIDE 5: 211.5 pg/mL — AB (ref 0.0–100.0)

## 2018-05-09 MED ORDER — INSULIN ASPART 100 UNIT/ML ~~LOC~~ SOLN
0.0000 [IU] | Freq: Three times a day (TID) | SUBCUTANEOUS | Status: DC
  Administered 2018-05-09: 11 [IU] via SUBCUTANEOUS

## 2018-05-09 MED ORDER — HYDROCODONE-ACETAMINOPHEN 5-325 MG PO TABS
1.0000 | ORAL_TABLET | ORAL | Status: DC | PRN
  Administered 2018-05-09: 2 via ORAL
  Filled 2018-05-09: qty 2

## 2018-05-09 MED ORDER — LEVOFLOXACIN IN D5W 500 MG/100ML IV SOLN
500.0000 mg | Freq: Once | INTRAVENOUS | Status: AC
  Administered 2018-05-09: 500 mg via INTRAVENOUS
  Filled 2018-05-09: qty 100

## 2018-05-09 MED ORDER — LEVALBUTEROL HCL 1.25 MG/0.5ML IN NEBU
1.2500 mg | INHALATION_SOLUTION | Freq: Four times a day (QID) | RESPIRATORY_TRACT | Status: DC
  Administered 2018-05-09: 1.25 mg via RESPIRATORY_TRACT
  Filled 2018-05-09: qty 0.5

## 2018-05-09 MED ORDER — IPRATROPIUM BROMIDE 0.02 % IN SOLN
0.5000 mg | Freq: Four times a day (QID) | RESPIRATORY_TRACT | Status: DC
  Administered 2018-05-10 – 2018-05-12 (×12): 0.5 mg via RESPIRATORY_TRACT
  Filled 2018-05-09 (×12): qty 2.5

## 2018-05-09 MED ORDER — BUDESONIDE 0.5 MG/2ML IN SUSP
0.5000 mg | Freq: Two times a day (BID) | RESPIRATORY_TRACT | Status: DC
  Filled 2018-05-09: qty 2

## 2018-05-09 MED ORDER — ATORVASTATIN CALCIUM 20 MG PO TABS
20.0000 mg | ORAL_TABLET | Freq: Every evening | ORAL | Status: DC
  Administered 2018-05-09 – 2018-05-12 (×4): 20 mg via ORAL
  Filled 2018-05-09 (×4): qty 1

## 2018-05-09 MED ORDER — IPRATROPIUM BROMIDE 0.02 % IN SOLN
0.5000 mg | Freq: Four times a day (QID) | RESPIRATORY_TRACT | Status: DC
  Administered 2018-05-09: 0.5 mg via RESPIRATORY_TRACT
  Filled 2018-05-09: qty 2.5

## 2018-05-09 MED ORDER — IPRATROPIUM-ALBUTEROL 0.5-2.5 (3) MG/3ML IN SOLN
3.0000 mL | Freq: Once | RESPIRATORY_TRACT | Status: AC
  Administered 2018-05-09: 3 mL via RESPIRATORY_TRACT
  Filled 2018-05-09: qty 3

## 2018-05-09 MED ORDER — INSULIN ASPART 100 UNIT/ML ~~LOC~~ SOLN
0.0000 [IU] | Freq: Every day | SUBCUTANEOUS | Status: DC
  Administered 2018-05-09: 5 [IU] via SUBCUTANEOUS

## 2018-05-09 MED ORDER — INSULIN GLARGINE 100 UNIT/ML ~~LOC~~ SOLN
20.0000 [IU] | Freq: Every day | SUBCUTANEOUS | Status: DC
  Administered 2018-05-09: 20 [IU] via SUBCUTANEOUS
  Filled 2018-05-09: qty 0.2

## 2018-05-09 MED ORDER — BUDESONIDE 0.5 MG/2ML IN SUSP
0.5000 mg | Freq: Two times a day (BID) | RESPIRATORY_TRACT | Status: DC
  Administered 2018-05-09 – 2018-05-13 (×8): 0.5 mg via RESPIRATORY_TRACT
  Filled 2018-05-09 (×8): qty 2

## 2018-05-09 MED ORDER — ACETAMINOPHEN 325 MG PO TABS
650.0000 mg | ORAL_TABLET | Freq: Four times a day (QID) | ORAL | Status: DC | PRN
  Administered 2018-05-12 (×2): 650 mg via ORAL
  Filled 2018-05-09 (×2): qty 2

## 2018-05-09 MED ORDER — LEVOFLOXACIN IN D5W 750 MG/150ML IV SOLN: 750.0000 mg | INTRAVENOUS | Status: DC

## 2018-05-09 MED ORDER — ONDANSETRON HCL 4 MG/2ML IJ SOLN
4.0000 mg | Freq: Four times a day (QID) | INTRAMUSCULAR | Status: DC | PRN
  Administered 2018-05-10 (×2): 4 mg via INTRAVENOUS
  Filled 2018-05-09 (×2): qty 2

## 2018-05-09 MED ORDER — POTASSIUM CHLORIDE CRYS ER 20 MEQ PO TBCR
40.0000 meq | EXTENDED_RELEASE_TABLET | Freq: Once | ORAL | Status: AC
  Administered 2018-05-09: 40 meq via ORAL
  Filled 2018-05-09: qty 2

## 2018-05-09 MED ORDER — LISINOPRIL 5 MG PO TABS: 5.0000 mg | ORAL_TABLET | Freq: Every day | ORAL | Status: DC

## 2018-05-09 MED ORDER — ACETAMINOPHEN 650 MG RE SUPP: 650.0000 mg | Freq: Four times a day (QID) | RECTAL | Status: DC | PRN

## 2018-05-09 MED ORDER — SODIUM CHLORIDE 0.9 % IV SOLN
INTRAVENOUS | Status: AC
  Administered 2018-05-09 – 2018-05-10 (×2): via INTRAVENOUS

## 2018-05-09 MED ORDER — METHYLPREDNISOLONE SODIUM SUCC 40 MG IJ SOLR
40.0000 mg | Freq: Four times a day (QID) | INTRAMUSCULAR | Status: DC
  Administered 2018-05-09 – 2018-05-11 (×7): 40 mg via INTRAVENOUS
  Filled 2018-05-09 (×7): qty 1

## 2018-05-09 MED ORDER — ALBUTEROL SULFATE (2.5 MG/3ML) 0.083% IN NEBU
2.5000 mg | INHALATION_SOLUTION | Freq: Once | RESPIRATORY_TRACT | Status: AC
  Administered 2018-05-09: 2.5 mg via RESPIRATORY_TRACT
  Filled 2018-05-09: qty 3

## 2018-05-09 MED ORDER — SENNOSIDES-DOCUSATE SODIUM 8.6-50 MG PO TABS
1.0000 | ORAL_TABLET | Freq: Every evening | ORAL | Status: DC | PRN
Start: 2018-05-09 — End: 2018-05-13

## 2018-05-09 MED ORDER — MAGNESIUM SULFATE 2 GM/50ML IV SOLN
2.0000 g | Freq: Once | INTRAVENOUS | Status: AC
  Administered 2018-05-09: 2 g via INTRAVENOUS
  Filled 2018-05-09: qty 50

## 2018-05-09 MED ORDER — ONDANSETRON HCL 4 MG PO TABS: 4.0000 mg | ORAL_TABLET | Freq: Four times a day (QID) | ORAL | Status: DC | PRN

## 2018-05-09 MED ORDER — ENOXAPARIN SODIUM 40 MG/0.4ML ~~LOC~~ SOLN
40.0000 mg | SUBCUTANEOUS | Status: DC
  Administered 2018-05-09 – 2018-05-12 (×4): 40 mg via SUBCUTANEOUS
  Filled 2018-05-09 (×4): qty 0.4

## 2018-05-09 MED ORDER — LEVALBUTEROL HCL 1.25 MG/0.5ML IN NEBU
1.2500 mg | INHALATION_SOLUTION | Freq: Four times a day (QID) | RESPIRATORY_TRACT | Status: DC
  Administered 2018-05-10 – 2018-05-12 (×12): 1.25 mg via RESPIRATORY_TRACT
  Filled 2018-05-09 (×12): qty 0.5

## 2018-05-09 MED ORDER — LEVALBUTEROL HCL 1.25 MG/0.5ML IN NEBU
1.2500 mg | INHALATION_SOLUTION | Freq: Four times a day (QID) | RESPIRATORY_TRACT | Status: DC
  Filled 2018-05-09: qty 0.5

## 2018-05-09 MED ORDER — IPRATROPIUM BROMIDE 0.02 % IN SOLN: 0.5000 mg | Freq: Four times a day (QID) | RESPIRATORY_TRACT | Status: DC

## 2018-05-09 NOTE — ED Triage Notes (Addendum)
[facility] reports pt sick since W with NV and brown/green mucous, self treating with tylenol, inhalers, and mucinex.  At doc office today pt given 2 duonebs and dx with right sided PNA. [facility] gave 2 albuterol and 125 solumedrol for a total of 20 of albuterol. Hx DM cbg 221

## 2018-05-09 NOTE — ED Provider Notes (Signed)
[place] EMERGENCY DEPARTMENT Provider Note   CSN: [id number] Arrival date & time: 05/09/18  1331     History   Chief Complaint Chief Complaint  Patient presents with  . Pneumonia    HPI Amanda Barajas is a 67 y.o. female.  Patient states she is had a fever for a number days and has been coughing up yellow-green stuff and  has become short of breath.  Patient has history of COPD.  Patient has stopped smoking  The history is provided by the patient. No language interpreter was used.  Shortness of Breath  This is a recurrent problem. The problem occurs frequently.The current episode started more than 2 days ago. The problem has not changed since onset.Associated symptoms include a fever and wheezing. Pertinent negatives include no headaches, no cough, no chest pain, no abdominal pain and no rash. Precipitated by: Unknown. Risk factors include smoking (cOPD). She has tried ipratropium inhalers for the symptoms. The treatment provided mild relief. She has had prior hospitalizations. She has had prior ED visits. She has had no prior ICU admissions. Associated medical issues include COPD.    Past Medical History:  Diagnosis Date  . Bronchitis     Patient Active Problem List   Diagnosis Date Noted  . COPD with acute exacerbation ([place]) 08/05/2016  . Hypokalemia 08/05/2016  . Hyperglycemia 08/05/2016  . Acute respiratory failure with hypoxia ([place]) 08/05/2016    Past Surgical History:  Procedure Laterality Date  . BREAST LUMPECTOMY Left    age 56 - was benign  . CESAREAN SECTION     x 3     OB History   None      Home Medications    Prior to Admission medications   Medication Sig Start Date End Date Taking? Authorizing Provider  albuterol (PROVENTIL HFA;VENTOLIN HFA) 108 (90 Base) MCG/ACT inhaler Inhale 1-2 puffs into the lungs every 4 (four) hours as needed for wheezing or shortness of breath.   Yes [provider]  albuterol (PROVENTIL)  (2.5 MG/3ML) 0.083% nebulizer solution Take 2.5 mg by nebulization every 4 (four) hours as needed for wheezing or shortness of breath.   Yes [provider]  atorvastatin (LIPITOR) 20 MG tablet Take 20 mg by mouth every evening. 04/15/18  Yes [provider]  azithromycin (ZITHROMAX) 250 MG tablet Take 250 mg by mouth daily. Take 2 tablets on day 1 then 1 tablet daily until finished 05/06/18 05/11/18 Yes [provider]  budesonide-formoterol (SYMBICORT) 80-4.5 MCG/ACT inhaler Inhale 2 puffs into the lungs 2 (two) times daily.   Yes [provider]  diphenhydramine-acetaminophen (TYLENOL PM) 25-500 MG TABS tablet Take 2 tablets by mouth at bedtime.   Yes [provider]  fluticasone (FLONASE) 50 MCG/ACT nasal spray Place 1 spray into both nostrils daily as needed for allergies or rhinitis.   Yes [provider]  ibuprofen (ADVIL,MOTRIN) 200 MG tablet Take 400 mg by mouth every 6 (six) hours as needed for fever, headache, mild pain, moderate pain or cramping.   Yes [provider]  Insulin Glargine (LANTUS) 100 UNIT/ML Solostar Pen Inject 15 Units into the skin daily at 10 pm. Patient taking differently: Inject 20 Units into the skin daily at 10 pm.  08/07/16  Yes Patrecia Pour, MD  lisinopril (PRINIVIL,ZESTRIL) 5 MG tablet Take 5 mg by mouth daily. 04/21/18  Yes [provider]  metFORMIN (GLUCOPHAGE-XR) 500 MG 24 hr tablet Take 1 tablet (500 mg total) by mouth  daily with breakfast. Patient taking differently: Take 1,000 mg by mouth 2 (two) times daily.  08/07/16  Yes Patrecia Pour, MD  Multiple Vitamin (MULTIVITAMIN WITH MINERALS) TABS tablet Take 1 tablet by mouth daily.   Yes [provider]  naproxen (NAPROSYN) 500 MG tablet Take 500 mg by mouth every 12 (twelve) hours as needed for pain. 03/05/18  Yes [provider]  vitamin B-12 (CYANOCOBALAMIN) 500 MCG tablet Take 500 mcg by mouth daily.   Yes [provider]  blood glucose meter kit and supplies Dispense based on patient and insurance preference. Use four times daily as directed. (FOR ICD-9 250.00, 250.01). 08/07/16   Patrecia Pour, MD  Insulin Pen Needle 31G X 5 MM MISC BD Pen Needles- brand specific Inject insulin via insulin pen 6 x daily 08/07/16   Patrecia Pour, MD    Family History Family History  Problem Relation Age of Onset  . Heart attack Mother   . Emphysema Mother   . Heart attack Father     Social History Social History   Tobacco Use  . Smoking status: Current Every Day Smoker    Packs/day: 2.00    Years: 46.00    Pack years: 92.00  . Smokeless tobacco: Never Used  Substance Use Topics  . Alcohol use: No  . Drug use: No     Allergies   Penicillins and Eggs or egg-derived products   Review of Systems Review of Systems  Constitutional: Positive for fever. Negative for appetite change and fatigue.  HENT: Negative for congestion, ear discharge and sinus pressure.   Eyes: Negative for discharge.  Respiratory: Positive for shortness of breath and wheezing. Negative for cough.   Cardiovascular: Negative for chest pain.  Gastrointestinal: Negative for abdominal pain and diarrhea.  Genitourinary: Negative for frequency and hematuria.  Musculoskeletal: Negative for back pain.  Skin: Negative for rash.  Neurological: Negative for seizures and headaches.  Psychiatric/Behavioral: Negative for hallucinations.     Physical Exam Updated Vital Signs BP (!) 173/70   Pulse (!) 112   Temp 98.9 F (37.2 C) (Oral)   Resp (!) 27   Ht _0  (1.6 m)   Wt 89.8 kg   BMI 35.07 kg/m   Physical Exam  Constitutional: She is oriented to person, place, and time. She appears well-developed.  HENT:  Head: Normocephalic.  Eyes: Conjunctivae and EOM are normal. No scleral icterus.  Neck: Neck supple. No thyromegaly present.  Cardiovascular: Normal rate and regular rhythm. Exam reveals no gallop and no friction  rub.  No murmur heard. Pulmonary/Chest: No stridor. She has wheezes. She has no rales. She exhibits no tenderness.  Abdominal: She exhibits no distension. There is no tenderness. There is no rebound.  Musculoskeletal: Normal range of motion. She exhibits no edema.  Lymphadenopathy:    She has no cervical adenopathy.  Neurological: She is oriented to person, place, and time. She exhibits normal muscle tone. Coordination normal.  Skin: No rash noted. No erythema.  Psychiatric: She has a normal mood and affect. Her behavior is normal.     ED Treatments / Results  Labs (all labs ordered are listed, but only abnormal results are displayed) Labs Reviewed  COMPREHENSIVE METABOLIC PANEL - Abnormal; Notable for the following components:      Result Value   Potassium 3.2 (*)    Chloride 95 (*)    Glucose, Bld 281 (*)    Calcium 8.6 (*)    Albumin 3.0 (*)  ALT 77 (*)    Alkaline Phosphatase 133 (*)    Anion gap 16 (*)    All other components within normal limits  CBC WITH DIFFERENTIAL/PLATELET - Abnormal; Notable for the following components:   WBC 19.2 (*)    Neutro Abs 16.8 (*)    All other components within normal limits  BRAIN NATRIURETIC PEPTIDE - Abnormal; Notable for the following components:   B Natriuretic Peptide 211.5 (*)    All other components within normal limits  I-STAT CG4 LACTIC ACID, ED - Abnormal; Notable for the following components:   Lactic Acid, Venous 2.08 (*)    All other components within normal limits  I-STAT CHEM 8, ED - Abnormal; Notable for the following components:   Potassium 3.2 (*)    Chloride 95 (*)    Glucose, Bld 289 (*)    Calcium, Ion 0.98 (*)    All other components within normal limits  CULTURE, BLOOD (ROUTINE X 2)  CULTURE, BLOOD (ROUTINE X 2)  PROTIME-INR  URINALYSIS, ROUTINE W REFLEX MICROSCOPIC  I-STAT TROPONIN, ED  I-STAT CG4 LACTIC ACID, ED    EKG EKG Interpretation  Date/Time:  Sunday May 09 2018 13:39:16  EDT Ventricular Rate:  113 PR Interval:    QRS Duration: 84 QT Interval:  340 QTC Calculation: 467 R Axis:   69 Text Interpretation:  Sinus tachycardia Anterior infarct, old Nonspecific T abnormalities, lateral leads Confirmed by Milton Ferguson [phone]) on 05/09/2018 2:58:03 PM   Radiology Dg Chest Port 1 View  Result Date: 05/09/2018 CLINICAL DATA:  Shortness of breath. EXAM: PORTABLE CHEST 1 VIEW COMPARISON:  08/05/2016 FINDINGS: Mildly degraded exam due to AP portable technique and patient body habitus. Numerous leads and wires project over the chest. Midline trachea. Normal heart size. No definite pleural fluid. No pneumothorax. Low lung volumes with resultant pulmonary interstitial prominence. No lobar consolidation. IMPRESSION: Low lung volumes, without definite acute disease. Decreased sensitivity and specificity exam due to technique related factors, as described above. Electronically Signed   By: Abigail Miyamoto M.D.   On: 05/09/2018 14:34    Procedures Procedures (including critical care time)  Medications Ordered in ED Medications  levofloxacin (LEVAQUIN) IVPB 500 mg (0 mg Intravenous Stopped 05/09/18 1532)  magnesium sulfate IVPB 2 g 50 mL (0 g Intravenous Stopped 05/09/18 1528)  potassium chloride SA (K-DUR,KLOR-CON) CR tablet 40 mEq (40 mEq Oral Given 05/09/18 1438)  ipratropium-albuterol (DUONEB) 0.5-2.5 (3) MG/3ML nebulizer solution 3 mL (3 mLs Nebulization Given 05/09/18 1443)  albuterol (PROVENTIL) (2.5 MG/3ML) 0.083% nebulizer solution 2.5 mg (2.5 mg Nebulization Given 05/09/18 1443)     Initial Impression / Assessment and Plan / ED Course  I have reviewed the triage vital signs and the nursing notes.  Pertinent labs & imaging results that were available during my care of the patient were reviewed by me and considered in my medical decision making (see chart for details). CRITICAL CARE Performed by: Milton Ferguson Total critical care time: 45 minutes Critical care time was  exclusive of separately billable procedures and treating other patients. Critical care was necessary to treat or prevent imminent or life-threatening deterioration. Critical care was time spent personally by me on the following activities: development of treatment plan with patient and/or surrogate as well as nursing, discussions with consultants, evaluation of patient's response to treatment, examination of patient, obtaining history from patient or surrogate, ordering and performing treatments and interventions, ordering and review of laboratory studies, ordering and review of radiographic studies, pulse oximetry and  re-evaluation of patient's condition.     With significant hypoxia secondary to COPD exacerbation.  Patient has been given numerous neb treatments and steroids.  She is improving but her sats are still about 90% on 5 L nasal.  Chest x-ray does not show pneumonia but I suspect she does have a pneumonia patient will be admitted to medicine for COPD exacerbation and possible pneumonia  Final Clinical Impressions(s) / ED Diagnoses   Final diagnoses:  COPD exacerbation [facility])    ED Discharge Orders    None       Milton Ferguson, MD 05/09/18 1539

## 2018-05-09 NOTE — Progress Notes (Signed)
RT note- Patient transported to 2W on Bipap, patient is requesting to take Bipap off. 5L min [place] placed. Continue to monitor.

## 2018-05-09 NOTE — H&P (Signed)
History and Physical    Amanda Barajas UXN:[id number] DOB: June 21, 1951 DOA: 05/09/2018  PCP: Marda Stalker, PA-C Patient coming from: Home  Chief Complaint: Shortness of breath  HPI: Amanda Barajas is a 67 y.o. female with medical history significant of with past medical history of tobacco use quit smoking 2 years ago, COPD, diabetes mellitus type 2 comes to the hospital with complains of shortness of breath.  Patient states she started feeling short of breath about 5 days ago and over the course of last 3 days she has had subjective fevers and chills at home.  She has been bringing up greenish-brown thick sputum.  She tried using home nebulizer without much relief.  According to the family and the patient she initially refused to seek medical attention but this morning it was worse therefore went to urgent care and was sent to the hospital given her severity of shortness of breath. Patient quit smoking cigarettes 2 years ago and used to be to pack cigarettes daily smoker.  Since then she uses weight without nicotine.  Denies any alcohol and illicit drug use.  Admits of medication compliance.  Denies any previous history of intubation.  In the ER initially patient was noted to be quite dyspneic with very diminished breath sounds throughout.  She was placed on 5 L nasal cannula and saturating read about 88%.  On her labs she was noted to have lactic acid of 2.08, potassium 3.2.  Her WBC was 19.2.  Chest x-ray was overall clear.  Due to concerns of acute COPD exacerbation and underlying pneumonia she was started on nebulizer treatments, Solu-Medrol and Levaquin.  Medical team was requested to admit the patient for further care monitoring.   Review of Systems: As per HPI otherwise 10 point review of systems negative.  Review of Systems Otherwise negative except as per HPI, including: General: Denies  unintended weight loss. Resp: Denies hemoptysis Cardiac: Denies chest pain, palpitations,  orthopnea, paroxysmal nocturnal dyspnea. GI: Denies abdominal pain, nausea, vomiting, diarrhea or constipation GU: Denies dysuria, frequency, hesitancy or incontinence MS: Denies muscle aches, joint pain or swelling Neuro: Denies headache, neurologic deficits (focal weakness, numbness, tingling), abnormal gait Psych: Denies anxiety, depression, SI/HI/AVH Skin: Denies new rashes or lesions ID: Denies sick contacts, exotic exposures, travel  Past Medical History:  Diagnosis Date  . Bronchitis     Past Surgical History:  Procedure Laterality Date  . BREAST LUMPECTOMY Left    age 79 - was benign  . CESAREAN SECTION     x 3    SOCIAL HISTORY:  reports that she has been smoking. She has a 92.00 pack-year smoking history. She has never used smokeless tobacco. She reports that she does not drink alcohol or use drugs.  Allergies  Allergen Reactions  . Penicillins Hives    Has patient had a PCN reaction causing immediate rash, facial/tongue/throat swelling, SOB or lightheadedness with hypotension: Yes Has patient had a PCN reaction causing severe rash involving mucus membranes or skin necrosis: Yes Has patient had a PCN reaction that required hospitalization No Has patient had a PCN reaction occurring within the last 10 years: No  If all of the above answers are "NO", then may proceed with Cephalosporin use.   . Eggs Or Egg-Derived Products Diarrhea and Nausea And Vomiting    FAMILY HISTORY: Family History  Problem Relation Age of Onset  . Heart attack Mother   . Emphysema Mother   . Heart attack Father  Prior to Admission medications   Medication Sig Start Date End Date Taking? Authorizing Provider  albuterol (PROVENTIL HFA;VENTOLIN HFA) 108 (90 Base) MCG/ACT inhaler Inhale 1-2 puffs into the lungs every 4 (four) hours as needed for wheezing or shortness of breath.   Yes [provider]  albuterol (PROVENTIL) (2.5 MG/3ML) 0.083% nebulizer solution Take 2.5 mg by  nebulization every 4 (four) hours as needed for wheezing or shortness of breath.   Yes [provider]  atorvastatin (LIPITOR) 20 MG tablet Take 20 mg by mouth every evening. 04/15/18  Yes [provider]  azithromycin (ZITHROMAX) 250 MG tablet Take 250 mg by mouth daily. Take 2 tablets on day 1 then 1 tablet daily until finished 05/06/18 05/11/18 Yes [provider]  budesonide-formoterol (SYMBICORT) 80-4.5 MCG/ACT inhaler Inhale 2 puffs into the lungs 2 (two) times daily.   Yes [provider]  diphenhydramine-acetaminophen (TYLENOL PM) 25-500 MG TABS tablet Take 2 tablets by mouth at bedtime.   Yes [provider]  fluticasone (FLONASE) 50 MCG/ACT nasal spray Place 1 spray into both nostrils daily as needed for allergies or rhinitis.   Yes [provider]  ibuprofen (ADVIL,MOTRIN) 200 MG tablet Take 400 mg by mouth every 6 (six) hours as needed for fever, headache, mild pain, moderate pain or cramping.   Yes [provider]  Insulin Glargine (LANTUS) 100 UNIT/ML Solostar Pen Inject 15 Units into the skin daily at 10 pm. Patient taking differently: Inject 20 Units into the skin daily at 10 pm.  08/07/16  Yes Patrecia Pour, MD  lisinopril (PRINIVIL,ZESTRIL) 5 MG tablet Take 5 mg by mouth daily. 04/21/18  Yes [provider]  metFORMIN (GLUCOPHAGE-XR) 500 MG 24 hr tablet Take 1 tablet (500 mg total) by mouth daily with breakfast. Patient taking differently: Take 1,000 mg by mouth 2 (two) times daily.  08/07/16  Yes Patrecia Pour, MD  Multiple Vitamin (MULTIVITAMIN WITH MINERALS) TABS tablet Take 1 tablet by mouth daily.   Yes [provider]  naproxen (NAPROSYN) 500 MG tablet Take 500 mg by mouth every 12 (twelve) hours as needed for pain. 03/05/18  Yes [provider]  vitamin B-12 (CYANOCOBALAMIN) 500 MCG tablet Take 500 mcg by mouth daily.   Yes [provider]  blood glucose meter kit and supplies  Dispense based on patient and insurance preference. Use four times daily as directed. (FOR ICD-9 250.00, 250.01). 08/07/16   Patrecia Pour, MD  Insulin Pen Needle 31G X 5 MM MISC BD Pen Needles- brand specific Inject insulin via insulin pen 6 x daily 08/07/16   Patrecia Pour, MD    Physical Exam: Vitals:   05/09/18 1400 05/09/18 1415 05/09/18 1430 05/09/18 1534  BP: (!) 169/52 (!) 188/52 (!) 173/70   Pulse: (!) 117 (!) 113 (!) 112 (!) 106  Resp: (!) 28 (!) 33 (!) 27 (!) 29  Temp:      TempSrc:      SpO2:    96%  Weight:      Height:          Constitutional: Slight distress due to short of breath.  Unable to speak in full sentences. Eyes: PERRL, lids and conjunctivae normal ENMT: Mucous membranes are moist. Posterior pharynx clear of any exudate or lesions.Normal dentition.  Neck: normal, supple, no masses, no thyromegaly Respiratory: Diffuse diminished breath sounds with clear use of neck accessory muscles Cardiovascular: Regular rate and rhythm, no murmurs / rubs / gallops. No extremity  edema. 2+ pedal pulses. No carotid bruits.  Abdomen: no tenderness, no masses palpated. No hepatosplenomegaly. Bowel sounds positive.  Musculoskeletal: no clubbing / cyanosis. No joint deformity upper and lower extremities. Good ROM, no contractures. Normal muscle tone.  Skin: no rashes, lesions, ulcers. No induration Neurologic: CN 2-12 grossly intact. Sensation intact, DTR normal. Strength 5/5 in all 4.  Psychiatric: Normal judgment and insight. Alert and oriented x 3. Normal mood.     Labs on Admission: I have personally reviewed following labs and imaging studies  CBC: Recent Labs  Lab 05/09/18 1415 05/09/18 1427  WBC 19.2*  --   NEUTROABS 16.8*  --   HGB 12.3 13.3  HCT 38.7 39.0  MCV 92.8  --   PLT 318  --    Basic Metabolic Panel: Recent Labs  Lab 05/09/18 1415 05/09/18 1427  NA 138 136  K 3.2* 3.2*  CL 95* 95*  CO2 27  --   GLUCOSE 281* 289*  BUN 9 11  CREATININE 0.83  0.60  CALCIUM 8.6*  --    GFR: Estimated Creatinine Clearance: 75.6 mL/min (by C-G formula based on SCr of 0.6 mg/dL). Liver Function Tests: Recent Labs  Lab 05/09/18 1415  AST 39  ALT 77*  ALKPHOS 133*  BILITOT 0.9  PROT 7.5  ALBUMIN 3.0*   No results for input(s): LIPASE, AMYLASE in the last 168 hours. No results for input(s): AMMONIA in the last 168 hours. Coagulation Profile: Recent Labs  Lab 05/09/18 1415  INR 1.09   Cardiac Enzymes: No results for input(s): CKTOTAL, CKMB, CKMBINDEX, TROPONINI in the last 168 hours. BNP (last 3 results) No results for input(s): PROBNP in the last 8760 hours. HbA1C: No results for input(s): HGBA1C in the last 72 hours. CBG: No results for input(s): GLUCAP in the last 168 hours. Lipid Profile: No results for input(s): CHOL, HDL, LDLCALC, TRIG, CHOLHDL, LDLDIRECT in the last 72 hours. Thyroid Function Tests: No results for input(s): TSH, T4TOTAL, FREET4, T3FREE, THYROIDAB in the last 72 hours. Anemia Panel: No results for input(s): VITAMINB12, FOLATE, FERRITIN, TIBC, IRON, RETICCTPCT in the last 72 hours. Urine analysis: No results found for: COLORURINE, APPEARANCEUR, LABSPEC, PHURINE, GLUCOSEU, HGBUR, BILIRUBINUR, KETONESUR, PROTEINUR, UROBILINOGEN, NITRITE, LEUKOCYTESUR Sepsis Labs: !!!!!!!!!!!!!!!!!!!!!!!!!!!!!!!!!!!!!!!!!!!! _0 (procalcitonin:4,lacticidven:4) )No results found for this or any previous visit (from the past 240 hour(s)).   Radiological Exams on Admission: Dg Chest Port 1 View  Result Date: 05/09/2018 CLINICAL DATA:  Shortness of breath. EXAM: PORTABLE CHEST 1 VIEW COMPARISON:  08/05/2016 FINDINGS: Mildly degraded exam due to AP portable technique and patient body habitus. Numerous leads and wires project over the chest. Midline trachea. Normal heart size. No definite pleural fluid. No pneumothorax. Low lung volumes with resultant pulmonary interstitial prominence. No lobar consolidation. IMPRESSION: Low lung  volumes, without definite acute disease. Decreased sensitivity and specificity exam due to technique related factors, as described above. Electronically Signed   By: Abigail Miyamoto M.D.   On: 05/09/2018 14:34     All images have been reviewed by me personally.    Assessment/Plan Principal Problem:   Acute respiratory failure (HCC) Active Problems:   COPD with acute exacerbation (HCC)   CAP (community acquired pneumonia)   DM2 (diabetes mellitus, type 2) ([place])    Acute respiratory failure requiring BiPAP.  Use of accessory muscles Acute exacerbation of mild persistent COPD Early community acquired pneumonia versus acute bacterial bronchitis -Admit the patient to the hospital for further care and monitoring -Bronchodilators ordered as needed and scheduled.  Pulmicort twice daily - Solu-Medrol 60 mg every 6 hours - Currently we will place the patient on BiPAP and take her off as deemed appropriate.  Currently she is using accessory muscles and after extensive discussion with her and to prevent intubation she has agreed for this. -Continue to provide supportive care - IV Levaquin ordered -Counseled to quit using her vape  Diabetes mellitus type 2, insulin-dependent - Continue home Lantus.  Insulin sliding scale and Accu-Chek -Hold home metformin dose.   DVT prophylaxis: Subcutaneous Lovenox Code Status: Full code Family Communication: Family at bedside including husband, son and daughter Disposition Plan: To be determined Consults called: None Admission status: Stepdown unit admission   Time Spent: 65 minutes.  >50% of the time was devoted to discussing the patients care, assessment, plan and disposition with other care givers along with counseling the patient about the risks and benefits of treatment.   Please Note: This patient record was dictated using Editor, commissioning. Chart creation errors have been sought, but may not always have been located. Such creation errors do not  reflect on the Standard of Medical Care.   Ankit Arsenio Loader MD [facility] Pager [phone]  If 7PM-7AM, please contact night-coverage [email or web address] Password [email or web address]  05/09/2018, 3:52 PM

## 2018-05-09 NOTE — ED Notes (Signed)
Dr Estell HarpinZammit at bedside and frequently checking on pt status

## 2018-05-09 NOTE — ED Notes (Signed)
Report attempted 

## 2018-05-09 NOTE — ED Notes (Signed)
Called resp for bi-pap  

## 2018-05-10 ENCOUNTER — Encounter (HOSPITAL_COMMUNITY): Payer: Self-pay | Admitting: Family Medicine

## 2018-05-10 DIAGNOSIS — I10 Essential (primary) hypertension: Secondary | ICD-10-CM

## 2018-05-10 DIAGNOSIS — R739 Hyperglycemia, unspecified: Secondary | ICD-10-CM

## 2018-05-10 LAB — GLUCOSE, CAPILLARY
GLUCOSE-CAPILLARY: 288 mg/dL — AB (ref 70–99)
GLUCOSE-CAPILLARY: 270 mg/dL — AB (ref 70–99)
GLUCOSE-CAPILLARY: 255 mg/dL — AB (ref 70–99)
Glucose-Capillary: 374 mg/dL — ABNORMAL HIGH (ref 70–99)
Glucose-Capillary: 252 mg/dL — ABNORMAL HIGH (ref 70–99)

## 2018-05-10 LAB — BASIC METABOLIC PANEL
Anion gap: 11 (ref 5–15)
BUN: 19 mg/dL (ref 8–23)
CALCIUM: 8.1 mg/dL — AB (ref 8.9–10.3)
CO2: 30 mmol/L (ref 22–32)
Chloride: 98 mmol/L (ref 98–111)
Creatinine, Ser: 0.88 mg/dL (ref 0.44–1.00)
GFR calc Af Amer: >60 mL/min (ref >60)
Glucose, Bld: 296 mg/dL — ABNORMAL HIGH (ref 70–99)
Potassium: 3.9 mmol/L (ref 3.5–5.1)
Sodium: 139 mmol/L (ref 135–145)

## 2018-05-10 LAB — HEMOGLOBIN A1C
Hgb A1c MFr Bld: 7.6 % — ABNORMAL HIGH (ref 4.8–5.6)
Mean Plasma Glucose: 171.42 mg/dL

## 2018-05-10 LAB — CBC
HCT: 35.3 % — ABNORMAL LOW (ref 36.0–46.0)
Hemoglobin: 11.1 g/dL — ABNORMAL LOW (ref 12.0–15.0)
MCH: 29.5 pg (ref 26.0–34.0)
MCHC: 31.4 g/dL (ref 30.0–36.0)
MCV: 93.9 fL (ref 78.0–100.0)
Platelets: 285 10*3/uL (ref 150–400)
RBC: 3.76 MIL/uL — ABNORMAL LOW (ref 3.87–5.11)
RDW: 13.5 % (ref 11.5–15.5)
WBC: 17.7 10*3/uL — ABNORMAL HIGH (ref 4.0–10.5)

## 2018-05-10 LAB — LACTIC ACID, PLASMA: LACTIC ACID, VENOUS: 0.9 mmol/L (ref 0.5–1.9)

## 2018-05-10 MED ORDER — INSULIN ASPART 100 UNIT/ML ~~LOC~~ SOLN
12.0000 [IU] | Freq: Three times a day (TID) | SUBCUTANEOUS | Status: DC
  Administered 2018-05-10: 12 [IU] via SUBCUTANEOUS

## 2018-05-10 MED ORDER — INSULIN GLARGINE 100 UNIT/ML ~~LOC~~ SOLN
36.0000 [IU] | Freq: Every day | SUBCUTANEOUS | Status: DC
  Administered 2018-05-10: 36 [IU] via SUBCUTANEOUS
  Filled 2018-05-10 (×2): qty 0.36

## 2018-05-10 MED ORDER — ORAL CARE MOUTH RINSE
15.0000 mL | Freq: Two times a day (BID) | OROMUCOSAL | Status: DC
  Administered 2018-05-10 – 2018-05-12 (×6): 15 mL via OROMUCOSAL

## 2018-05-10 MED ORDER — INSULIN ASPART 100 UNIT/ML ~~LOC~~ SOLN
0.0000 [IU] | Freq: Three times a day (TID) | SUBCUTANEOUS | Status: DC
  Administered 2018-05-10 (×3): 11 [IU] via SUBCUTANEOUS
  Administered 2018-05-11: 4 [IU] via SUBCUTANEOUS
  Administered 2018-05-11 – 2018-05-12 (×3): 7 [IU] via SUBCUTANEOUS
  Administered 2018-05-12 (×2): 4 [IU] via SUBCUTANEOUS
  Administered 2018-05-13: 5 [IU] via SUBCUTANEOUS

## 2018-05-10 MED ORDER — LEVOFLOXACIN 750 MG PO TABS
750.0000 mg | ORAL_TABLET | ORAL | Status: DC
  Administered 2018-05-10 – 2018-05-12 (×3): 750 mg via ORAL
  Filled 2018-05-10 (×3): qty 1

## 2018-05-10 MED ORDER — LISINOPRIL 5 MG PO TABS
15.0000 mg | ORAL_TABLET | Freq: Every day | ORAL | Status: DC
  Administered 2018-05-10: 15 mg via ORAL
  Filled 2018-05-10: qty 1

## 2018-05-10 MED ORDER — INSULIN ASPART 100 UNIT/ML ~~LOC~~ SOLN
10.0000 [IU] | Freq: Three times a day (TID) | SUBCUTANEOUS | Status: DC
  Administered 2018-05-10 (×2): 10 [IU] via SUBCUTANEOUS

## 2018-05-10 MED ORDER — HYDRALAZINE HCL 20 MG/ML IJ SOLN
10.0000 mg | INTRAMUSCULAR | Status: DC | PRN
  Administered 2018-05-11 – 2018-05-12 (×2): 10 mg via INTRAVENOUS
  Filled 2018-05-10 (×2): qty 1

## 2018-05-10 MED ORDER — CHLORHEXIDINE GLUCONATE 0.12 % MT SOLN
15.0000 mL | Freq: Two times a day (BID) | OROMUCOSAL | Status: DC
  Administered 2018-05-10 – 2018-05-13 (×7): 15 mL via OROMUCOSAL
  Filled 2018-05-10 (×7): qty 15

## 2018-05-10 MED ORDER — INSULIN ASPART 100 UNIT/ML ~~LOC~~ SOLN
0.0000 [IU] | Freq: Every day | SUBCUTANEOUS | Status: DC
  Administered 2018-05-10: 3 [IU] via SUBCUTANEOUS

## 2018-05-10 MED ORDER — INSULIN GLARGINE 100 UNIT/ML ~~LOC~~ SOLN
30.0000 [IU] | Freq: Every day | SUBCUTANEOUS | Status: DC
  Filled 2018-05-10: qty 0.3

## 2018-05-10 NOTE — Progress Notes (Signed)
PROGRESS NOTE    Amanda JohnDebra B Crampton  WUJ:[id number]  DOB: [birth year]  DOA: 05/09/2018 PCP: Jarrett SohoWharton, Courtney, PA-C   Brief Admission Hx:  Amanda Barajas is a 67 y.o. female with medical history significant of with past medical history of tobacco use quit smoking 2 years ago, COPD, diabetes mellitus type 2 comes to the hospital with complains of shortness of breath.  She was admitted with acute COPD exacerbation.  She stopped smoking 2 years ago.   MDM/Assessment & Plan:   1. Acute COPD exacerbation - Pt is off bipap now.  Continue scheduled solumedrol, nebs, antibiotics and supportive therapy.  2. CAP  - Continue levofloxacin and supportive care.  3. Type 2 DM, insulin requiring - Pt having steroid induced hyperglycemia - Increase basal insulin, intensify supplemental sliding scale coverage and provide prandial novolog coverage.  Follow and adjust therapy as needed.  Check A1c.  4. Hypokalemia - repleted.   5. Essential Hypertension - suboptimally controlled, resumed home medications, adding hydralazine IV as needed.   DVT prophylaxis: lovenox Code Status: Full  Family Communication: patient Disposition Plan: Continue treatment in SDU   Antimicrobials:  Levofloxacin 8/18 -    Subjective: Pt did not like having the bipap so had them to remove it. She is now on nasal cannula. She is tolerating it so far.  She reports no SOB or chest pain.    Objective: Vitals:   05/10/18 0159 05/10/18 0347 05/10/18 0356 05/10/18 0603  BP:   (!) 148/67 (!) 157/70  Pulse:   79   Resp:   19   Temp:   98.7 F (37.1 C)   TempSrc:   Oral   SpO2: 97%  90% 97%  Weight:  97.8 kg    Height:        Intake/Output Summary (Last 24 hours) at 05/10/2018 0612 Last data filed at 05/09/2018 1532 Gross per 24 hour  Intake 137.76 ml  Output -  Net 137.76 ml   Filed Weights   05/09/18 1337 05/09/18 1818 05/10/18 0347  Weight: 89.8 kg 96.5 kg 97.8 kg     REVIEW OF SYSTEMS  As per history otherwise  all reviewed and reported negative  Exam:  General exam: awake, alert, NAD.   Respiratory system: diffuse insp/exp wheezes, rales. No increased work of breathing. Cardiovascular system: S1 & S2 heard, RRR. No JVD, murmurs, gallops, clicks or pedal edema. Gastrointestinal system: Abdomen is nondistended, soft and nontender. Normal bowel sounds heard. Central nervous system: Alert and oriented. No focal neurological deficits. Extremities: no CCE.  Data Reviewed: Basic Metabolic Panel: Recent Labs  Lab 05/09/18 1415 05/09/18 1427 05/09/18 1624 05/10/18 0322  NA 138 136  --  139  K 3.2* 3.2*  --  3.9  CL 95* 95*  --  98  CO2 27  --   --  30  GLUCOSE 281* 289*  --  296*  BUN 9 11  --  19  CREATININE 0.83 0.60 0.87 0.88  CALCIUM 8.6*  --   --  8.1*   Liver Function Tests: Recent Labs  Lab 05/09/18 1415  AST 39  ALT 77*  ALKPHOS 133*  BILITOT 0.9  PROT 7.5  ALBUMIN 3.0*   No results for input(s): LIPASE, AMYLASE in the last 168 hours. No results for input(s): AMMONIA in the last 168 hours. CBC: Recent Labs  Lab 05/09/18 1415 05/09/18 1427 05/09/18 1624 05/10/18 0322  WBC 19.2*  --  17.9* 17.7*  NEUTROABS 16.8*  --   --   --  HGB 12.3 13.3 11.9* 11.1*  HCT 38.7 39.0 37.3 35.3*  MCV 92.8  --  93.7 93.9  PLT 318  --  266 285   Cardiac Enzymes: No results for input(s): CKTOTAL, CKMB, CKMBINDEX, TROPONINI in the last 168 hours. CBG (last 3)  Recent Labs    05/09/18 1856  GLUCAP 340*   Recent Results (from the past 240 hour(s))  MRSA PCR Screening     Status: None   Collection Time: 05/09/18  6:24 PM  Result Value Ref Range Status   MRSA by PCR NEGATIVE NEGATIVE Final    Comment:        The GeneXpert MRSA Assay (FDA approved for NASAL specimens only), is one component of a comprehensive MRSA colonization surveillance program. It is not intended to diagnose MRSA infection nor to guide or monitor treatment for MRSA infections. Performed at [facility], [address] [place], [place] [address]      Studies: Dg Chest Port 1 View  Result Date: 05/09/2018 CLINICAL DATA:  Shortness of breath. EXAM: PORTABLE CHEST 1 VIEW COMPARISON:  08/05/2016 FINDINGS: Mildly degraded exam due to AP portable technique and patient body habitus. Numerous leads and wires project over the chest. Midline trachea. Normal heart size. No definite pleural fluid. No pneumothorax. Low lung volumes with resultant pulmonary interstitial prominence. No lobar consolidation. IMPRESSION: Low lung volumes, without definite acute disease. Decreased sensitivity and specificity exam due to technique related factors, as described above. Electronically Signed   By: Jeronimo GreavesKyle  Talbot M.D.   On: 05/09/2018 14:34     Scheduled Meds: . atorvastatin  20 mg Oral QPM  . budesonide (PULMICORT) nebulizer solution  0.5 mg Nebulization BID  . chlorhexidine  15 mL Mouth Rinse BID  . enoxaparin (LOVENOX) injection  40 mg Subcutaneous Q24H  . insulin aspart  0-20 Units Subcutaneous TID WC  . insulin aspart  0-5 Units Subcutaneous QHS  . insulin aspart  10 Units Subcutaneous TID WC  . insulin glargine  30 Units Subcutaneous Q2200  . ipratropium  0.5 mg Nebulization Q6H  . levalbuterol  1.25 mg Nebulization Q6H  . lisinopril  15 mg Oral Daily  . mouth rinse  15 mL Mouth Rinse q12n4p  . methylPREDNISolone (SOLU-MEDROL) injection  40 mg Intravenous Q6H   Continuous Infusions: . sodium chloride 35 mL/hr at 05/10/18 0608  . levofloxacin (LEVAQUIN) IV      Principal Problem:   Acute respiratory failure (HCC) Active Problems:   COPD with acute exacerbation (HCC)   CAP (community acquired pneumonia)   DM2 (diabetes mellitus, type 2) (HCC)   Essential hypertension  Critical Care Time spent: 31 mins  Standley Dakinslanford Tiasia Weberg, MD, FAAFP [facility] Pager [phone]  If 7PM-7AM, please contact night-coverage [email or web address] Password [email or web address] 05/10/2018, 6:12 AM    LOS: 1 day

## 2018-05-10 NOTE — Progress Notes (Signed)
PHARMACIST - PHYSICIAN COMMUNICATION DR:   Laural BenesJohnson CONCERNING: Antibiotic IV to Oral Route Change Policy  RECOMMENDATION: This patient is receiving levofloxacin by the intravenous route.  Based on criteria approved by the Pharmacy and Therapeutics Committee, the antibiotic(s) is/are being converted to the equivalent oral dose form(s).   DESCRIPTION: These criteria include:  Patient being treated for a respiratory tract infection, urinary tract infection, cellulitis or clostridium difficile associated diarrhea if on metronidazole  The patient is not neutropenic and does not exhibit a GI malabsorption state  The patient is eating (either orally or via tube) and/or has been taking other orally administered medications for a least 24 hours  The patient is improving clinically and has a Tmax < 100.5  If you have questions about this conversion, please contact the Pharmacy Department  []   [phone]( [phone] )  [facility] []   [phone]( [phone] )  [facility] [x]   [phone]( [phone] )  [facility] []   [phone]( [phone] )  [facility] []   [phone]( [phone] )  [facility]

## 2018-05-11 LAB — HIV ANTIBODY (ROUTINE TESTING W REFLEX): HIV Screen 4th Generation wRfx: NONREACTIVE

## 2018-05-11 LAB — GLUCOSE, CAPILLARY
GLUCOSE-CAPILLARY: 174 mg/dL — AB (ref 70–99)
Glucose-Capillary: 236 mg/dL — ABNORMAL HIGH (ref 70–99)
Glucose-Capillary: 240 mg/dL — ABNORMAL HIGH (ref 70–99)
Glucose-Capillary: 127 mg/dL — ABNORMAL HIGH (ref 70–99)

## 2018-05-11 MED ORDER — INSULIN GLARGINE 100 UNIT/ML ~~LOC~~ SOLN
40.0000 [IU] | Freq: Every day | SUBCUTANEOUS | Status: DC
  Administered 2018-05-11 – 2018-05-12 (×2): 40 [IU] via SUBCUTANEOUS
  Filled 2018-05-11 (×2): qty 0.4

## 2018-05-11 MED ORDER — LISINOPRIL 20 MG PO TABS
20.0000 mg | ORAL_TABLET | Freq: Every day | ORAL | Status: DC
  Administered 2018-05-11 – 2018-05-13 (×3): 20 mg via ORAL
  Filled 2018-05-11 (×3): qty 1

## 2018-05-11 MED ORDER — METHYLPREDNISOLONE SODIUM SUCC 40 MG IJ SOLR
40.0000 mg | Freq: Three times a day (TID) | INTRAMUSCULAR | Status: DC
  Administered 2018-05-11 – 2018-05-12 (×3): 40 mg via INTRAVENOUS
  Filled 2018-05-11 (×3): qty 1

## 2018-05-11 MED ORDER — NICOTINE 21 MG/24HR TD PT24: 21.0000 mg | MEDICATED_PATCH | Freq: Every day | TRANSDERMAL | Status: DC | PRN

## 2018-05-11 MED ORDER — AMLODIPINE BESYLATE 5 MG PO TABS
5.0000 mg | ORAL_TABLET | Freq: Every day | ORAL | Status: DC
  Administered 2018-05-11 – 2018-05-13 (×3): 5 mg via ORAL
  Filled 2018-05-11 (×3): qty 1

## 2018-05-11 MED ORDER — INSULIN ASPART 100 UNIT/ML ~~LOC~~ SOLN
14.0000 [IU] | Freq: Three times a day (TID) | SUBCUTANEOUS | Status: DC
  Administered 2018-05-11 – 2018-05-13 (×6): 14 [IU] via SUBCUTANEOUS

## 2018-05-11 NOTE — Progress Notes (Signed)
PROGRESS NOTE   Amanda JohnDebra B Shoaf  WJX:[id number]  DOB: 05/08/1951  DOA: 05/09/2018 PCP: Jarrett SohoWharton, Courtney, PA-C  Brief Admission Hx:  Amanda Barajas is a 67 y.o. female with medical history significant of with past medical history of tobacco use quit smoking 2 years ago, COPD, diabetes mellitus type 2 comes to the hospital with complains of shortness of breath.  She was admitted with acute COPD exacerbation.  She stopped smoking 2 years ago.   MDM/Assessment & Plan:   1. Acute COPD exacerbation - Pt is off bipap now.  She remains on HFNC but reports clinical improvement.  Continue scheduled solumedrol, nebs, antibiotics and supportive therapy.  Plan to wean oxygen today as tolerated.  Up to chair, ambulate in room with assistance.   2. CAP  - Continue levofloxacin and supportive care.  3. Type 2 DM, insulin requiring - Pt having steroid induced hyperglycemia - Increased basal insulin, intensify supplemental sliding scale coverage and increased prandial novolog coverage.  Follow and adjust therapy as needed.  A1c 7.6%.  De-escalate insulin when steroids are weaned down.    4. Hypokalemia - repleted.   5. Essential Hypertension - suboptimally controlled, resumed home medications, increase lisinopril to 20 mg, add amlodipine 5 mg, continue hydralazine IV as needed.  6. Tobacco Abuse - Pt was counseled on smoking cessation at the bedside and verbalized understanding.  Nicotine patch ordered for cravings.   DVT prophylaxis: lovenox Code Status: Full  Family Communication: patient Disposition Plan: Continue treatment in SDU, high risk to go back on bipap  Antimicrobials:  Levofloxacin 8/18 -    Subjective: Pt says that she is less short of breath and she did sleep better.  No chest pain.   Objective: Vitals:   05/10/18 2324 05/11/18 0249 05/11/18 0404 05/11/18 0500  BP: (!) 173/69  (!) 168/65   Pulse: (!) 101  91   Resp: 20  (!) 21   Temp: 98.5 F (36.9 C)  98.2 F (36.8 C)     TempSrc: Oral  Oral   SpO2: 97% 95% 100%   Weight:    97.7 kg  Height:        Intake/Output Summary (Last 24 hours) at 05/11/2018 0617 Last data filed at 05/10/2018 1700 Gross per 24 hour  Intake 1392.03 ml  Output 600 ml  Net 792.03 ml   Filed Weights   05/09/18 1818 05/10/18 0347 05/11/18 0500  Weight: 96.5 kg 97.8 kg 97.7 kg   REVIEW OF SYSTEMS  As per history otherwise all reviewed and reported negative  Exam:  General exam: awake, alert, NAD.   Respiratory system: diffuse insp/exp wheezes, rales. No increased work of breathing. Cardiovascular system: S1 & S2 heard, RRR. No JVD, murmurs, gallops, clicks or pedal edema. Gastrointestinal system: Abdomen is nondistended, soft and nontender. Normal bowel sounds heard. Central nervous system: Alert and oriented. No focal neurological deficits. Extremities: no CCE.  Data Reviewed: Basic Metabolic Panel: Recent Labs  Lab 05/09/18 1415 05/09/18 1427 05/09/18 1624 05/10/18 0322  NA 138 136  --  139  K 3.2* 3.2*  --  3.9  CL 95* 95*  --  98  CO2 27  --   --  30  GLUCOSE 281* 289*  --  296*  BUN 9 11  --  19  CREATININE 0.83 0.60 0.87 0.88  CALCIUM 8.6*  --   --  8.1*   Liver Function Tests: Recent Labs  Lab 05/09/18 1415  AST 39  ALT 77*  ALKPHOS 133*  BILITOT 0.9  PROT 7.5  ALBUMIN 3.0*   No results for input(s): LIPASE, AMYLASE in the last 168 hours. No results for input(s): AMMONIA in the last 168 hours. CBC: Recent Labs  Lab 05/09/18 1415 05/09/18 1427 05/09/18 1624 05/10/18 0322  WBC 19.2*  --  17.9* 17.7*  NEUTROABS 16.8*  --   --   --   HGB 12.3 13.3 11.9* 11.1*  HCT 38.7 39.0 37.3 35.3*  MCV 92.8  --  93.7 93.9  PLT 318  --  266 285   Cardiac Enzymes: No results for input(s): CKTOTAL, CKMB, CKMBINDEX, TROPONINI in the last 168 hours. CBG (last 3)  Recent Labs    05/10/18 1149 05/10/18 1749 05/10/18 2126  GLUCAP 252* 270* 255*   Recent Results (from the past 240 hour(s))  Culture,  blood (Routine x 2)     Status: None (Preliminary result)   Collection Time: 05/09/18  1:46 PM  Result Value Ref Range Status   Specimen Description BLOOD SITE NOT SPECIFIED  Final   Special Requests   Final    BOTTLES DRAWN AEROBIC AND ANAEROBIC Blood Culture adequate volume   Culture   Final    NO GROWTH < 24 HOURS Performed at [facility], [address] [place], [place] [address]    Report Status PENDING  Incomplete  Culture, blood (Routine x 2)     Status: None (Preliminary result)   Collection Time: 05/09/18  2:17 PM  Result Value Ref Range Status   Specimen Description BLOOD RIGHT ANTECUBITAL  Final   Special Requests   Final    BOTTLES DRAWN AEROBIC AND ANAEROBIC Blood Culture adequate volume   Culture   Final    NO GROWTH < 24 HOURS Performed at [facility], [address] [place], [place] [address]    Report Status PENDING  Incomplete  MRSA PCR Screening     Status: None   Collection Time: 05/09/18  6:24 PM  Result Value Ref Range Status   MRSA by PCR NEGATIVE NEGATIVE Final    Comment:        The GeneXpert MRSA Assay (FDA approved for NASAL specimens only), is one component of a comprehensive MRSA colonization surveillance program. It is not intended to diagnose MRSA infection nor to guide or monitor treatment for MRSA infections. Performed at [facility], [address] [place], [place] [address]     Studies: Dg Chest Port 1 View  Result Date: 05/09/2018 CLINICAL DATA:  Shortness of breath. EXAM: PORTABLE CHEST 1 VIEW COMPARISON:  08/05/2016 FINDINGS: Mildly degraded exam due to AP portable technique and patient body habitus. Numerous leads and wires project over the chest. Midline trachea. Normal heart size. No definite pleural fluid. No pneumothorax. Low lung volumes with resultant pulmonary interstitial prominence. No lobar consolidation. IMPRESSION: Low lung volumes, without definite acute disease. Decreased sensitivity and specificity  exam due to technique related factors, as described above. Electronically Signed   By: Jeronimo GreavesKyle  Talbot M.D.   On: 05/09/2018 14:34   Scheduled Meds: . atorvastatin  20 mg Oral QPM  . budesonide (PULMICORT) nebulizer solution  0.5 mg Nebulization BID  . chlorhexidine  15 mL Mouth Rinse BID  . enoxaparin (LOVENOX) injection  40 mg Subcutaneous Q24H  . insulin aspart  0-20 Units Subcutaneous TID WC  . insulin aspart  0-5 Units Subcutaneous QHS  . insulin aspart  12 Units Subcutaneous TID WC  . insulin glargine  36 Units Subcutaneous Q2200  . ipratropium  0.5 mg Nebulization Q6H  . levalbuterol  1.25 mg Nebulization Q6H  . levofloxacin  750 mg Oral Q24H  . lisinopril  15 mg Oral Daily  . mouth rinse  15 mL Mouth Rinse q12n4p  . methylPREDNISolone (SOLU-MEDROL) injection  40 mg Intravenous Q6H   Continuous Infusions:   Principal Problem:   Acute respiratory failure (HCC) Active Problems:   COPD with acute exacerbation (HCC)   CAP (community acquired pneumonia)   DM2 (diabetes mellitus, type 2) (HCC)   Essential hypertension  Critical Care Time spent: 31 mins  Standley Dakins, MD, FAAFP [facility] Pager [phone]  If 7PM-7AM, please contact night-coverage [email or web address] Password [email or web address] 05/11/2018, 6:17 AM    LOS: 2 days

## 2018-05-12 ENCOUNTER — Inpatient Hospital Stay (HOSPITAL_COMMUNITY): Payer: BLUE CROSS/BLUE SHIELD

## 2018-05-12 DIAGNOSIS — Z794 Long term (current) use of insulin: Secondary | ICD-10-CM

## 2018-05-12 DIAGNOSIS — J181 Lobar pneumonia, unspecified organism: Secondary | ICD-10-CM

## 2018-05-12 DIAGNOSIS — E119 Type 2 diabetes mellitus without complications: Secondary | ICD-10-CM

## 2018-05-12 DIAGNOSIS — J9601 Acute respiratory failure with hypoxia: Secondary | ICD-10-CM

## 2018-05-12 DIAGNOSIS — J441 Chronic obstructive pulmonary disease with (acute) exacerbation: Secondary | ICD-10-CM

## 2018-05-12 LAB — BASIC METABOLIC PANEL
Anion gap: 8 (ref 5–15)
BUN: 31 mg/dL — AB (ref 8–23)
CALCIUM: 8.3 mg/dL — AB (ref 8.9–10.3)
CO2: 34 mmol/L — AB (ref 22–32)
CREATININE: 0.82 mg/dL (ref 0.44–1.00)
Chloride: 98 mmol/L (ref 98–111)
GFR calc Af Amer: >60 mL/min (ref >60)
GFR calc non Af Amer: >60 mL/min (ref >60)
GLUCOSE: 257 mg/dL — AB (ref 70–99)
Potassium: 4.7 mmol/L (ref 3.5–5.1)
Sodium: 140 mmol/L (ref 135–145)

## 2018-05-12 LAB — GLUCOSE, CAPILLARY
GLUCOSE-CAPILLARY: 116 mg/dL — AB (ref 70–99)
Glucose-Capillary: 247 mg/dL — ABNORMAL HIGH (ref 70–99)
Glucose-Capillary: 186 mg/dL — ABNORMAL HIGH (ref 70–99)
Glucose-Capillary: 152 mg/dL — ABNORMAL HIGH (ref 70–99)

## 2018-05-12 LAB — CBC
HEMATOCRIT: 38.1 % (ref 36.0–46.0)
Hemoglobin: 11.5 g/dL — ABNORMAL LOW (ref 12.0–15.0)
MCH: 29.2 pg (ref 26.0–34.0)
MCHC: 30.2 g/dL (ref 30.0–36.0)
MCV: 96.7 fL (ref 78.0–100.0)
Platelets: 343 10*3/uL (ref 150–400)
RBC: 3.94 MIL/uL (ref 3.87–5.11)
RDW: 13.9 % (ref 11.5–15.5)
WBC: 18.8 10*3/uL — ABNORMAL HIGH (ref 4.0–10.5)

## 2018-05-12 LAB — MAGNESIUM: Magnesium: 2.5 mg/dL — ABNORMAL HIGH (ref 1.7–2.4)

## 2018-05-12 MED ORDER — GUAIFENESIN ER 600 MG PO TB12
600.0000 mg | ORAL_TABLET | Freq: Two times a day (BID) | ORAL | Status: DC
  Administered 2018-05-12 – 2018-05-13 (×3): 600 mg via ORAL
  Filled 2018-05-12 (×3): qty 1

## 2018-05-12 MED ORDER — METHYLPREDNISOLONE SODIUM SUCC 40 MG IJ SOLR
40.0000 mg | Freq: Two times a day (BID) | INTRAMUSCULAR | Status: DC
  Administered 2018-05-12 – 2018-05-13 (×2): 40 mg via INTRAVENOUS
  Filled 2018-05-12 (×2): qty 1

## 2018-05-12 NOTE — Progress Notes (Signed)
PROGRESS NOTE  Amanda Barajas:[id number] DOB: 01/11/1951 DOA: 05/09/2018 PCP: Jarrett Soho, PA-C  HPI/Recap of past 24 hours:  Overall feeling better, but remain oxygen dependent, o2 dropped to 85 on room air while ambulating  Assessment/Plan: Principal Problem:   Acute respiratory failure (HCC) Active Problems:   COPD with acute exacerbation (HCC)   CAP (community acquired pneumonia)   DM2 (diabetes mellitus, type 2) (HCC)   Essential hypertension   Acute COPD exacerbation/CAP left lower lobe /acute hypoxic respiratory failure -she presented with tachypnea, hypoxia, lactic acidosis, o2 sats dropped to 88% on 5liter o2, she was put on bipap, admitted to stepdown unit -she is started on solumedrol, nebs, antibiotics and supportive therapy. -blood culture no growth, mrsa screening negative, sputum culture was not collected - Pt is improving, she is off bipap and transitioned to HFNC  -continue to improve, now off HFNC on regular nasal cannula oxygen supplement, though she remain oxygen dependent. cxr today  "Progression of mild left lower lobe atelectasis/infiltrate.  COPD." -wean o2 as tolerated, wean iv steroids, start incentive spirometer, will repeat ambulating pulse o2 in am, may need to d/c home on o2     Insulin dependent Type 2 DM,   - Pt having steroid induced hyperglycemia - Increased basal insulin, intensify supplemental sliding scale coverage and increased prandial novolog coverage.  Follow and adjust therapy as needed.  A1c 7.6%.  De-escalate insulin when steroids are weaned down.     Hypokalemia - repleted.    Essential Hypertension - suboptimally controlled, resumed home medications, increase lisinopril to 20 mg, add amlodipine 5 mg, continue hydralazine IV as needed.   Tobacco use, reports quit a year ago  Body mass index is 38.12 kg/m.   Code Status: full  Family Communication: patient   Disposition Plan: Transfer from stepdown to med surg, d/c  tele home tomorrow if continue to improve, may or may not need home o2, will need to repeat ambulating pulse o2   Consultants:  none  Procedures:  bipap  Antibiotics:  As above   Objective: BP (!) 165/67   Pulse 97   Temp 98.4 F (36.9 C) (Oral)   Resp (!) 22   Ht 5\' 3"  (1.6 m)   Wt 97.6 kg   SpO2 94%   BMI 38.12 kg/m   Intake/Output Summary (Last 24 hours) at 05/12/2018 1053 Last data filed at 05/12/2018 0550 Gross per 24 hour  Intake 477 ml  Output -  Net 477 ml   Filed Weights   05/11/18 0500 05/11/18 2333 05/12/18 0500  Weight: 97.7 kg 97.7 kg 97.6 kg    Exam: Patient is examined daily including today on 05/12/2018, exams remain the same as of yesterday except that has changed    General:  NAD  Cardiovascular: RRR  Respiratory: diminished at bases, mild end expiratory wheezing, no rhonchi  Abdomen: Soft/ND/NT, positive BS  Musculoskeletal: No Edema  Neuro: alert, oriented   Data Reviewed: Basic Metabolic Panel: Recent Labs  Lab 05/09/18 1415 05/09/18 1427 05/09/18 1624 05/10/18 0322 05/12/18 0217  NA 138 136  --  139 140  K 3.2* 3.2*  --  3.9 4.7  CL 95* 95*  --  98 98  CO2 27  --   --  30 34*  GLUCOSE 281* 289*  --  296* 257*  BUN 9 11  --  19 31*  CREATININE 0.83 0.60 0.87 0.88 0.82  CALCIUM 8.6*  --   --  8.1* 8.3*  MG  --   --   --   --  2.5*   Liver Function Tests: Recent Labs  Lab 05/09/18 1415  AST 39  ALT 77*  ALKPHOS 133*  BILITOT 0.9  PROT 7.5  ALBUMIN 3.0*   No results for input(s): LIPASE, AMYLASE in the last 168 hours. No results for input(s): AMMONIA in the last 168 hours. CBC: Recent Labs  Lab 05/09/18 1415 05/09/18 1427 05/09/18 1624 05/10/18 0322 05/12/18 0217  WBC 19.2*  --  17.9* 17.7* 18.8*  NEUTROABS 16.8*  --   --   --   --   HGB 12.3 13.3 11.9* 11.1* 11.5*  HCT 38.7 39.0 37.3 35.3* 38.1  MCV 92.8  --  93.7 93.9 96.7  PLT 318  --  266 285 343   Cardiac Enzymes:   No results for input(s):  CKTOTAL, CKMB, CKMBINDEX, TROPONINI in the last 168 hours. BNP (last 3 results) Recent Labs    05/09/18 1415  BNP 211.5*    ProBNP (last 3 results) No results for input(s): PROBNP in the last 8760 hours.  CBG: Recent Labs  Lab 05/11/18 0743 05/11/18 1130 05/11/18 1624 05/11/18 2115 05/12/18 0743  GLUCAP 236* 240* 174* 127* 247*    Recent Results (from the past 240 hour(s))  Culture, blood (Routine x 2)     Status: None (Preliminary result)   Collection Time: 05/09/18  1:46 PM  Result Value Ref Range Status   Specimen Description BLOOD SITE NOT SPECIFIED  Final   Special Requests   Final    BOTTLES DRAWN AEROBIC AND ANAEROBIC Blood Culture adequate volume   Culture   Final    NO GROWTH 2 DAYS Performed at [id number], [address] [place], [place] [address]    Report Status PENDING  Incomplete  Culture, blood (Routine x 2)     Status: None (Preliminary result)   Collection Time: 05/09/18  2:17 PM  Result Value Ref Range Status   Specimen Description BLOOD RIGHT ANTECUBITAL  Final   Special Requests   Final    BOTTLES DRAWN AEROBIC AND ANAEROBIC Blood Culture adequate volume   Culture   Final    NO GROWTH 2 DAYS Performed at [facility], [address] [place], [place] [address]    Report Status PENDING  Incomplete  MRSA PCR Screening     Status: None   Collection Time: 05/09/18  6:24 PM  Result Value Ref Range Status   MRSA by PCR NEGATIVE NEGATIVE Final    Comment:        The GeneXpert MRSA Assay (FDA approved for NASAL specimens only), is one component of a comprehensive MRSA colonization surveillance program. It is not intended to diagnose MRSA infection nor to guide or monitor treatment for MRSA infections. Performed at [facility], [address] [place], [place] [address]      Studies: Dg Chest Port 1 View  Result Date: 05/12/2018 CLINICAL DATA:  Community-acquired pneumonia EXAM: PORTABLE CHEST 1 VIEW COMPARISON:   05/09/2018 FINDINGS: Heart size upper normal. Negative for heart failure. COPD with hyperinflation. Mild left lower lobe airspace disease has progressed in the interval and may represent atelectasis or pneumonia. No effusion or heart failure IMPRESSION: Progression of mild left lower lobe atelectasis/infiltrate.  COPD. Electronically Signed   By: Marlan Palauharles  Clark M.D.   On: 05/12/2018 07:29    Scheduled Meds: . amLODipine  5 mg Oral Daily  . atorvastatin  20 mg  Oral QPM  . budesonide (PULMICORT) nebulizer solution  0.5 mg Nebulization BID  . chlorhexidine  15 mL Mouth Rinse BID  . enoxaparin (LOVENOX) injection  40 mg Subcutaneous Q24H  . guaiFENesin  600 mg Oral BID  . insulin aspart  0-20 Units Subcutaneous TID WC  . insulin aspart  0-5 Units Subcutaneous QHS  . insulin aspart  14 Units Subcutaneous TID WC  . insulin glargine  40 Units Subcutaneous Q2200  . ipratropium  0.5 mg Nebulization Q6H  . levalbuterol  1.25 mg Nebulization Q6H  . levofloxacin  750 mg Oral Q24H  . lisinopril  20 mg Oral Daily  . mouth rinse  15 mL Mouth Rinse q12n4p  . methylPREDNISolone (SOLU-MEDROL) injection  40 mg Intravenous Q12H    Continuous Infusions:   Time spent: 35mins I have personally reviewed and interpreted on  05/12/2018 daily labs, tele strips, imagings as discussed above under date review session and assessment and plans.  I reviewed all nursing notes, pharmacy notes, consultant notes,  vitals, pertinent old records  I have discussed plan of care as described above with RN , patient  on 05/12/2018   Albertine GratesFang Cleotilde Spadaccini MD, PhD  [facility] Pager [phone]. If 7PM-7AM, please contact night-coverage at [email or web address], password [email or web address] 05/12/2018, 10:53 AM  LOS: 3 days

## 2018-05-12 NOTE — Progress Notes (Signed)
SATURATION QUALIFICATIONS:   Patient Saturations on Room Air at Rest = 87%  Patient Saturations on Room Air while Ambulating = 85%  Patient Saturations on 2 Liters of oxygen while Ambulating = 92%

## 2018-05-13 LAB — COMPREHENSIVE METABOLIC PANEL
ALBUMIN: 2.8 g/dL — AB (ref 3.5–5.0)
ALT: 49 U/L — AB (ref 0–44)
AST: 40 U/L (ref 15–41)
Alkaline Phosphatase: 90 U/L (ref 38–126)
Anion gap: 9 (ref 5–15)
BILIRUBIN TOTAL: 1.1 mg/dL (ref 0.3–1.2)
BUN: 20 mg/dL (ref 8–23)
CO2: 35 mmol/L — ABNORMAL HIGH (ref 22–32)
CREATININE: 0.69 mg/dL (ref 0.44–1.00)
Calcium: 8.3 mg/dL — ABNORMAL LOW (ref 8.9–10.3)
Chloride: 96 mmol/L — ABNORMAL LOW (ref 98–111)
GFR calc Af Amer: >60 mL/min (ref >60)
GLUCOSE: 187 mg/dL — AB (ref 70–99)
POTASSIUM: 4.5 mmol/L (ref 3.5–5.1)
Sodium: 140 mmol/L (ref 135–145)
Total Protein: 5.5 g/dL — ABNORMAL LOW (ref 6.5–8.1)

## 2018-05-13 LAB — CBC WITH DIFFERENTIAL/PLATELET
BASOS ABS: 0 10*3/uL (ref 0.0–0.1)
Basophils Relative: 0 %
EOS PCT: 0 %
Eosinophils Absolute: 0 10*3/uL (ref 0.0–0.7)
HEMATOCRIT: 38.3 % (ref 36.0–46.0)
Hemoglobin: 12.1 g/dL (ref 12.0–15.0)
LYMPHS ABS: 2.6 10*3/uL (ref 0.7–4.0)
LYMPHS PCT: 18 %
MCH: 29.3 pg (ref 26.0–34.0)
MCHC: 31.6 g/dL (ref 30.0–36.0)
MCV: 92.7 fL (ref 78.0–100.0)
MONOS PCT: 4 %
Monocytes Absolute: 0.6 10*3/uL (ref 0.1–1.0)
NEUTROS PCT: 78 %
Neutro Abs: 11.4 10*3/uL — ABNORMAL HIGH (ref 1.7–7.7)
Platelets: 380 10*3/uL (ref 150–400)
RBC: 4.13 MIL/uL (ref 3.87–5.11)
RDW: 13.6 % (ref 11.5–15.5)
WBC: 14.6 10*3/uL — AB (ref 4.0–10.5)

## 2018-05-13 LAB — GLUCOSE, CAPILLARY
GLUCOSE-CAPILLARY: 106 mg/dL — AB (ref 70–99)
Glucose-Capillary: 167 mg/dL — ABNORMAL HIGH (ref 70–99)

## 2018-05-13 MED ORDER — INSULIN ASPART 100 UNIT/ML FLEXPEN: PEN_INJECTOR | SUBCUTANEOUS | 0 refills | Status: AC

## 2018-05-13 MED ORDER — GUAIFENESIN ER 600 MG PO TB12: 600.0000 mg | ORAL_TABLET | Freq: Two times a day (BID) | ORAL | 0 refills | Status: AC

## 2018-05-13 MED ORDER — LISINOPRIL 20 MG PO TABS: 20.0000 mg | ORAL_TABLET | Freq: Every day | ORAL | 0 refills | Status: AC

## 2018-05-13 MED ORDER — LEVALBUTEROL HCL 1.25 MG/0.5ML IN NEBU
1.2500 mg | INHALATION_SOLUTION | Freq: Three times a day (TID) | RESPIRATORY_TRACT | Status: DC
  Administered 2018-05-13: 1.25 mg via RESPIRATORY_TRACT
  Filled 2018-05-13 (×2): qty 0.5

## 2018-05-13 MED ORDER — PREDNISONE 10 MG PO TABS: ORAL_TABLET | ORAL | 0 refills | Status: AC

## 2018-05-13 MED ORDER — IPRATROPIUM BROMIDE 0.02 % IN SOLN
0.5000 mg | Freq: Three times a day (TID) | RESPIRATORY_TRACT | Status: DC
  Administered 2018-05-13: 0.5 mg via RESPIRATORY_TRACT
  Filled 2018-05-13 (×2): qty 2.5

## 2018-05-13 MED ORDER — LEVOFLOXACIN 750 MG PO TABS: 750.0000 mg | ORAL_TABLET | ORAL | 0 refills | Status: AC

## 2018-05-13 NOTE — Care Management Note (Addendum)
Case Management Note  Patient Details  Name: Amanda Barajas MRN: [id number] Date of Birth: 01-12-1951  Subjective/Objective:  From home with spouse, pta indep, she will need home oxygen, NCM offered choice of oxygen agency, she [facility]  Ref[facility]na [facility], she states Vaughan BastaJermaine is doing DME today and she will give the message to him. NCM informed MD .                   Action/Plan: DC home when oxygen tank has been delivered to patient room.   Expected Discharge Date:  05/13/18               Expected Discharge Plan:  Home/Self Care  In-House Referral:     Discharge planning Services  CM Consult  Post Acute Care Choice:  Durable Medical Equipment Choice offered to:  Patient  DME Arranged:  Oxygen DME Agency:  [facility]  HH [facility]    [facility]:     Status of Service:  Completed, signed off  If discussed at Long Length of Stay Meetings, dates discussed:    Additional Comments:  Leone Havenaylor, Dionna Wiedemann [place], RN 05/13/2018, 10:09 AM

## 2018-05-13 NOTE — Discharge Summary (Signed)
Discharge Summary  Amanda Barajas QIW:[id number] DOB: 29-Jan-1951  PCP: Amanda Stalker, PA-C  Admit date: 05/09/2018 Discharge date: 05/13/2018  Time spent: 80mns, more than 50% time spent on coordination of care  Recommendations for Outpatient Follow-up:  1. F/u with PMD within a week  for hospital discharge follow up, repeat cbc/bmp at follow up. pcp to monitor blood pressure and blood glucose control 2. Patient is discharged on higher dose of lisinopril 3. Home o2 arranged  Discharge Diagnoses:  Active Hospital Problems   Diagnosis Date Noted  . Acute respiratory failure ([facility] 05/09/2018  . Essential hypertension 05/10/2018  . CAP (community acquired pneumonia) 05/09/2018  . DM2 (diabetes mellitus, type 2) ([facility] 05/09/2018  . COPD with acute exacerbation ([facility] 08/05/2016    Resolved Hospital Problems  No resolved problems to display.    Discharge Condition: stable  Diet recommendation: heart healthy/carb modified  Filed Weights   05/11/18 2333 05/12/18 0500 05/13/18 0554  Weight: 97.7 kg 97.6 kg 97.6 kg    History of present illness:  PCP: Amanda Stalker PA-C Patient coming from: Home  Chief Complaint: Shortness of breath  HPI: DKADEY MIHALICis a 67y.o. female with medical history significant of with past medical history of tobacco use quit smoking 2 years ago, COPD, diabetes mellitus type 2 comes to the hospital with complains of shortness of breath.  Patient states she started feeling short of breath about 5 days ago and over the course of last 3 days she has had subjective fevers and chills at home.  She has been bringing up greenish-brown thick sputum.  She tried using home nebulizer without much relief.  According to the family and the patient she initially refused to seek medical attention but this morning it was worse therefore went to urgent care and was sent to the hospital given her severity of shortness of breath. Patient quit smoking cigarettes 2  years ago and used to be to pack cigarettes daily smoker.  Since then she uses weight without nicotine.  Denies any alcohol and illicit drug use.  Admits of medication compliance.  Denies any previous history of intubation.  In the ER initially patient was noted to be quite dyspneic with very diminished breath sounds throughout.  She was placed on 5 L nasal cannula and saturating read about 88%.  On her labs she was noted to have lactic acid of 2.08, potassium 3.2.  Her WBC was 19.2.  Chest x-ray was overall clear.  Due to concerns of acute COPD exacerbation and underlying pneumonia she was started on nebulizer treatments, Solu-Medrol and Levaquin.  Medical team was requested to admit the patient for further care monitoring.  Hospital Course:  Principal Problem:   Acute respiratory failure ([facility] Active Problems:   COPD with acute exacerbation ([facility]   CAP (community acquired pneumonia)   DM2 (diabetes mellitus, type 2) ([facility]   Essential hypertension   Acute COPD exacerbation/CAP left lower lobe /acute hypoxic respiratory failure -she presented with tachypnea, hypoxia, lactic acidosis, o2 sats dropped to 88% on 5liter o2, she was put on bipap, admitted to stepdown unit -she is started on solumedrol, nebs, antibiotics and supportive therapy. -blood culture no growth, mrsa screening negative, sputum culture was not collected - Pt is improving, she is off bipap and transitioned to HFNC  -cxr today  "Progression of mild left lower lobe atelectasis/infiltrate. COPD." -Clinically improving, though she meet criteria to discharge on home o2 -She is discharged on prednisone taper, levaquin, pcp to repeat  cxr in 3-4 weeks     Insulin dependent Type 2 DM,   a1c 7.6 - Pt having steroid induced hyperglycemia - -taper steroids, she is discharged on lantus and ssi -she was previously not on ssi at home. -she is advised to check blood glucose three times day, follow up with pcp.  Hypokalemia - k  3.2 on presentation, repleted.  Repeat bmp at follow up  Essential Hypertension - She received increase lisinopril to 20 mg in the hospital, she is discharged on it Patient is advised to check blood pressure at home, further blood pressure meds adjustment per pcp.   Tobacco use, reports quit two years ago  Body mass index is 38.12 kg/m.   Code Status: full  Family Communication: patient   Disposition Plan:  home tomorrow if continue to improve, may or may not need home o2, will need to repeat ambulating pulse o2   Consultants:  none  Procedures:  bipap  Antibiotics:  As above   Discharge Exam: BP 136/83 (BP Location: Left Arm)   Pulse 93   Temp 98.3 F (36.8 C) (Oral)   Resp 18   Ht _0  (1.6 m)   Wt 97.6 kg   SpO2 95%   BMI 38.12 kg/m   General: NAD Cardiovascular: RRR Respiratory: improved aeration , previously heart wheezing has resolved  Discharge Instructions You were cared for by a hospitalist during your hospital stay. If you have any questions about your discharge medications or the care you received while you were in the hospital after you are discharged, you can call the unit and asked to speak with the hospitalist on call if the hospitalist that took care of you is not available. Once you are discharged, your primary care physician will handle any further medical issues. Please note that NO REFILLS for any discharge medications will be authorized once you are discharged, as it is imperative that you return to your primary care physician (or establish a relationship with a primary care physician if you do not have one) for your aftercare needs so that they can reassess your need for medications and monitor your lab values.  Discharge Instructions    Diet - low sodium heart healthy   Complete by:  As directed    Carb modified diet   Discharge instructions   Complete by:  As directed    Please check your blood pressure 1-2 times a  day, Please also check your blood glucose three times a day Follow up with your pcp for blood pressure and blood glucose monitoring and medication adjustment.   Increase activity slowly   Complete by:  As directed      Allergies as of 05/13/2018      Reactions   Penicillins Hives   Has patient had a PCN reaction causing immediate rash, facial/tongue/throat swelling, SOB or lightheadedness with hypotension: Yes Has patient had a PCN reaction causing severe rash involving mucus membranes or skin necrosis: Yes Has patient had a PCN reaction that required hospitalization No Has patient had a PCN reaction occurring within the last 10 years: No  If all of the above answers are "NO", then may proceed with Cephalosporin use.   Eggs Or Egg-derived Products Diarrhea, Nausea And Vomiting      Medication List    STOP taking these medications   azithromycin 250 MG tablet Commonly known as:  ZITHROMAX   naproxen 500 MG tablet Commonly known as:  NAPROSYN     TAKE these medications  albuterol 108 (90 Base) MCG/ACT inhaler Commonly known as:  PROVENTIL HFA;VENTOLIN HFA Inhale 1-2 puffs into the lungs every 4 (four) hours as needed for wheezing or shortness of breath.   albuterol (2.5 MG/3ML) 0.083% nebulizer solution Commonly known as:  PROVENTIL Take 2.5 mg by nebulization every 4 (four) hours as needed for wheezing or shortness of breath.   atorvastatin 20 MG tablet Commonly known as:  LIPITOR Take 20 mg by mouth every evening.   blood glucose meter kit and supplies Dispense based on patient and insurance preference. Use four times daily as directed. (FOR ICD-9 250.00, 250.01).   budesonide-formoterol 80-4.5 MCG/ACT inhaler Commonly known as:  SYMBICORT Inhale 2 puffs into the lungs 2 (two) times daily.   diphenhydramine-acetaminophen 25-500 MG Tabs tablet Commonly known as:  TYLENOL PM Take 2 tablets by mouth at bedtime.   fluticasone 50 MCG/ACT nasal spray Commonly known as:   FLONASE Place 1 spray into both nostrils daily as needed for allergies or rhinitis.   guaiFENesin 600 MG 12 hr tablet Commonly known as:  MUCINEX Take 1 tablet (600 mg total) by mouth 2 (two) times daily.   ibuprofen 200 MG tablet Commonly known as:  ADVIL,MOTRIN Take 400 mg by mouth every 6 (six) hours as needed for fever, headache, mild pain, moderate pain or cramping.   insulin aspart 100 UNIT/ML FlexPen Commonly known as:  NOVOLOG Before each meal 3 times a day, 140-199 - 2 units, 200-250 - 4 units, 251-299 - 6 units,  300-349 - 8 units,  350 or above 10 units. Insulin PEN if approved, provide syringes and needles if needed.   Insulin Glargine 100 UNIT/ML Solostar Pen Commonly known as:  LANTUS Inject 15 Units into the skin daily at 10 pm. What changed:  how much to take   Insulin Pen Needle 31G X 5 MM Misc BD Pen Needles- brand specific Inject insulin via insulin pen 6 x daily   levofloxacin 750 MG tablet Commonly known as:  LEVAQUIN Take 1 tablet (750 mg total) by mouth daily for 3 days.   lisinopril 20 MG tablet Commonly known as:  PRINIVIL,ZESTRIL Take 1 tablet (20 mg total) by mouth daily. Start taking on:  05/14/2018 What changed:    medication strength  how much to take   metFORMIN 500 MG 24 hr tablet Commonly known as:  GLUCOPHAGE-XR Take 1 tablet (500 mg total) by mouth daily with breakfast. What changed:    how much to take  when to take this   multivitamin with minerals Tabs tablet Take 1 tablet by mouth daily.   predniSONE 10 MG tablet Commonly known as:  DELTASONE Label  & dispense according to the schedule below. 6 Pills PO on day one then, 5 Pills PO on day two, 4 Pills PO on day three, 3Pills PO on day four, 2 Pills PO on day five, 1 Pills PO on day six,  then STOP.  Total of 21 tabs   vitamin B-12 500 MCG tablet Commonly known as:  CYANOCOBALAMIN Take 500 mcg by mouth daily.            Durable Medical Equipment  (From admission,  onward)         Start     Ordered   05/13/18 1058  DME Oxygen  Once    Question Answer Comment  Mode or (Route) Nasal cannula   Liters per Minute 2   Frequency Continuous (stationary and portable oxygen unit needed)   Oxygen delivery system  Gas      05/13/18 1057         Allergies  Allergen Reactions  . Penicillins Hives    Has patient had a PCN reaction causing immediate rash, facial/tongue/throat swelling, SOB or lightheadedness with hypotension: Yes Has patient had a PCN reaction causing severe rash involving mucus membranes or skin necrosis: Yes Has patient had a PCN reaction that required hospitalization No Has patient had a PCN reaction occurring within the last 10 years: No  If all of the above answers are "NO", then may proceed with Cephalosporin use.   . Eggs Or Egg-Derived Products Diarrhea and Nausea And Vomiting   Follow-up Information    Amanda Stalker, PA-C Follow up in 1 week(s).   Specialty:  Family Medicine Why:  hospital discharge follow up, repeat cbc/bmp at follow up repeat cxr 2view in 3-4 weeks to ensure resolution of pneumonia Contact information: [place] [address] [place] Follow up.   Why:  home oxygen Contact information: [address] [place] [address] [phone]            The results of significant diagnostics from this hospitalization (including imaging, microbiology, ancillary and laboratory) are listed below for reference.    Significant Diagnostic Studies: Dg Chest Port 1 View  Result Date: 05/12/2018 CLINICAL DATA:  Community-acquired pneumonia EXAM: PORTABLE CHEST 1 VIEW COMPARISON:  05/09/2018 FINDINGS: Heart size upper normal. Negative for heart failure. COPD with hyperinflation. Mild left lower lobe airspace disease has progressed in the interval and may represent atelectasis or pneumonia. No effusion or heart failure IMPRESSION: Progression  of mild left lower lobe atelectasis/infiltrate.  COPD. Electronically Signed   By: Franchot Gallo M.D.   On: 05/12/2018 07:29   Dg Chest Port 1 View  Result Date: 05/09/2018 CLINICAL DATA:  Shortness of breath. EXAM: PORTABLE CHEST 1 VIEW COMPARISON:  08/05/2016 FINDINGS: Mildly degraded exam due to AP portable technique and patient body habitus. Numerous leads and wires project over the chest. Midline trachea. Normal heart size. No definite pleural fluid. No pneumothorax. Low lung volumes with resultant pulmonary interstitial prominence. No lobar consolidation. IMPRESSION: Low lung volumes, without definite acute disease. Decreased sensitivity and specificity exam due to technique related factors, as described above. Electronically Signed   By: Abigail Miyamoto M.D.   On: 05/09/2018 14:34    Microbiology: Recent Results (from the past 240 hour(s))  Culture, blood (Routine x 2)     Status: None (Preliminary result)   Collection Time: 05/09/18  1:46 PM  Result Value Ref Range Status   Specimen Description BLOOD SITE NOT SPECIFIED  Final   Special Requests   Final    BOTTLES DRAWN AEROBIC AND ANAEROBIC Blood Culture adequate volume   Culture   Final    NO GROWTH 4 DAYS Performed at [facility], [address] [place], [place] [address]    Report Status PENDING  Incomplete  Culture, blood (Routine x 2)     Status: None (Preliminary result)   Collection Time: 05/09/18  2:17 PM  Result Value Ref Range Status   Specimen Description BLOOD RIGHT ANTECUBITAL  Final   Special Requests   Final    BOTTLES DRAWN AEROBIC AND ANAEROBIC Blood Culture adequate volume   Culture   Final    NO GROWTH 4 DAYS Performed at [facility], [place] [address] [place], [place] [address]  Report Status PENDING  Incomplete  MRSA PCR Screening     Status: None   Collection Time: 05/09/18  6:24 PM  Result Value Ref Range Status   MRSA by PCR NEGATIVE NEGATIVE Final    Comment:        The GeneXpert MRSA  Assay (FDA approved for NASAL specimens only), is one component of a comprehensive MRSA colonization surveillance program. It is not intended to diagnose MRSA infection nor to guide or monitor treatment for MRSA infections. Performed at [facility], [place] [address], [place], [place] [address]      Labs: Basic Metabolic Panel: Recent Labs  Lab 05/09/18 1415 05/09/18 1427 05/09/18 1624 05/10/18 0322 05/12/18 0217 05/13/18 0632  NA 138 136  --  139 140 140  K 3.2* 3.2*  --  3.9 4.7 4.5  CL 95* 95*  --  98 98 96*  CO2 27  --   --  30 34* 35*  GLUCOSE 281* 289*  --  296* 257* 187*  BUN 9 11  --  19 31* 20  CREATININE 0.83 0.60 0.87 0.88 0.82 0.69  CALCIUM 8.6*  --   --  8.1* 8.3* 8.3*  MG  --   --   --   --  2.5*  --    Liver Function Tests: Recent Labs  Lab 05/09/18 1415 05/13/18 0632  AST 39 40  ALT 77* 49*  ALKPHOS 133* 90  BILITOT 0.9 1.1  PROT 7.5 5.5*  ALBUMIN 3.0* 2.8*   No results for input(s): LIPASE, AMYLASE in the last 168 hours. No results for input(s): AMMONIA in the last 168 hours. CBC: Recent Labs  Lab 05/09/18 1415 05/09/18 1427 05/09/18 1624 05/10/18 0322 05/12/18 0217 05/13/18 0334  WBC 19.2*  --  17.9* 17.7* 18.8* 14.6*  NEUTROABS 16.8*  --   --   --   --  11.4*  HGB 12.3 13.3 11.9* 11.1* 11.5* 12.1  HCT 38.7 39.0 37.3 35.3* 38.1 38.3  MCV 92.8  --  93.7 93.9 96.7 92.7  PLT 318  --  266 285 343 380   Cardiac Enzymes: No results for input(s): CKTOTAL, CKMB, CKMBINDEX, TROPONINI in the last 168 hours. BNP: BNP (last 3 results) Recent Labs    05/09/18 1415  BNP 211.5*    ProBNP (last 3 results) No results for input(s): PROBNP in the last 8760 hours.  CBG: Recent Labs  Lab 05/12/18 0743 05/12/18 1156 05/12/18 1725 05/12/18 2210 05/13/18 0752  GLUCAP 247* 186* 152* 116* 167*       Signed:  Florencia Reasons MD, PhD  [facility] 05/13/2018, 11:00 AM

## 2018-05-14 LAB — CULTURE, BLOOD (ROUTINE X 2)
CULTURE: NO GROWTH
Culture: NO GROWTH
Special Requests: ADEQUATE
Special Requests: ADEQUATE

## 2018-05-20 DIAGNOSIS — J441 Chronic obstructive pulmonary disease with (acute) exacerbation: Secondary | ICD-10-CM | POA: Diagnosis not present

## 2018-05-20 DIAGNOSIS — I10 Essential (primary) hypertension: Secondary | ICD-10-CM | POA: Diagnosis not present

## 2018-05-20 DIAGNOSIS — E119 Type 2 diabetes mellitus without complications: Secondary | ICD-10-CM | POA: Diagnosis not present

## 2018-05-20 DIAGNOSIS — J181 Lobar pneumonia, unspecified organism: Secondary | ICD-10-CM | POA: Diagnosis not present

## 2018-12-24 DIAGNOSIS — E119 Type 2 diabetes mellitus without complications: Secondary | ICD-10-CM | POA: Diagnosis not present

## 2018-12-24 DIAGNOSIS — M25562 Pain in left knee: Secondary | ICD-10-CM | POA: Diagnosis not present

## 2018-12-24 DIAGNOSIS — I10 Essential (primary) hypertension: Secondary | ICD-10-CM | POA: Diagnosis not present

## 2018-12-24 DIAGNOSIS — E785 Hyperlipidemia, unspecified: Secondary | ICD-10-CM | POA: Diagnosis not present

## 2019-02-07 IMAGING — CR DG CHEST 1V PORT
1 series · 1 of 1 positions shown · non-contrast
Comparison: 08/05/2016

CLINICAL DATA: Shortness of breath.

EXAM:
PORTABLE CHEST 1 VIEW

[AP]
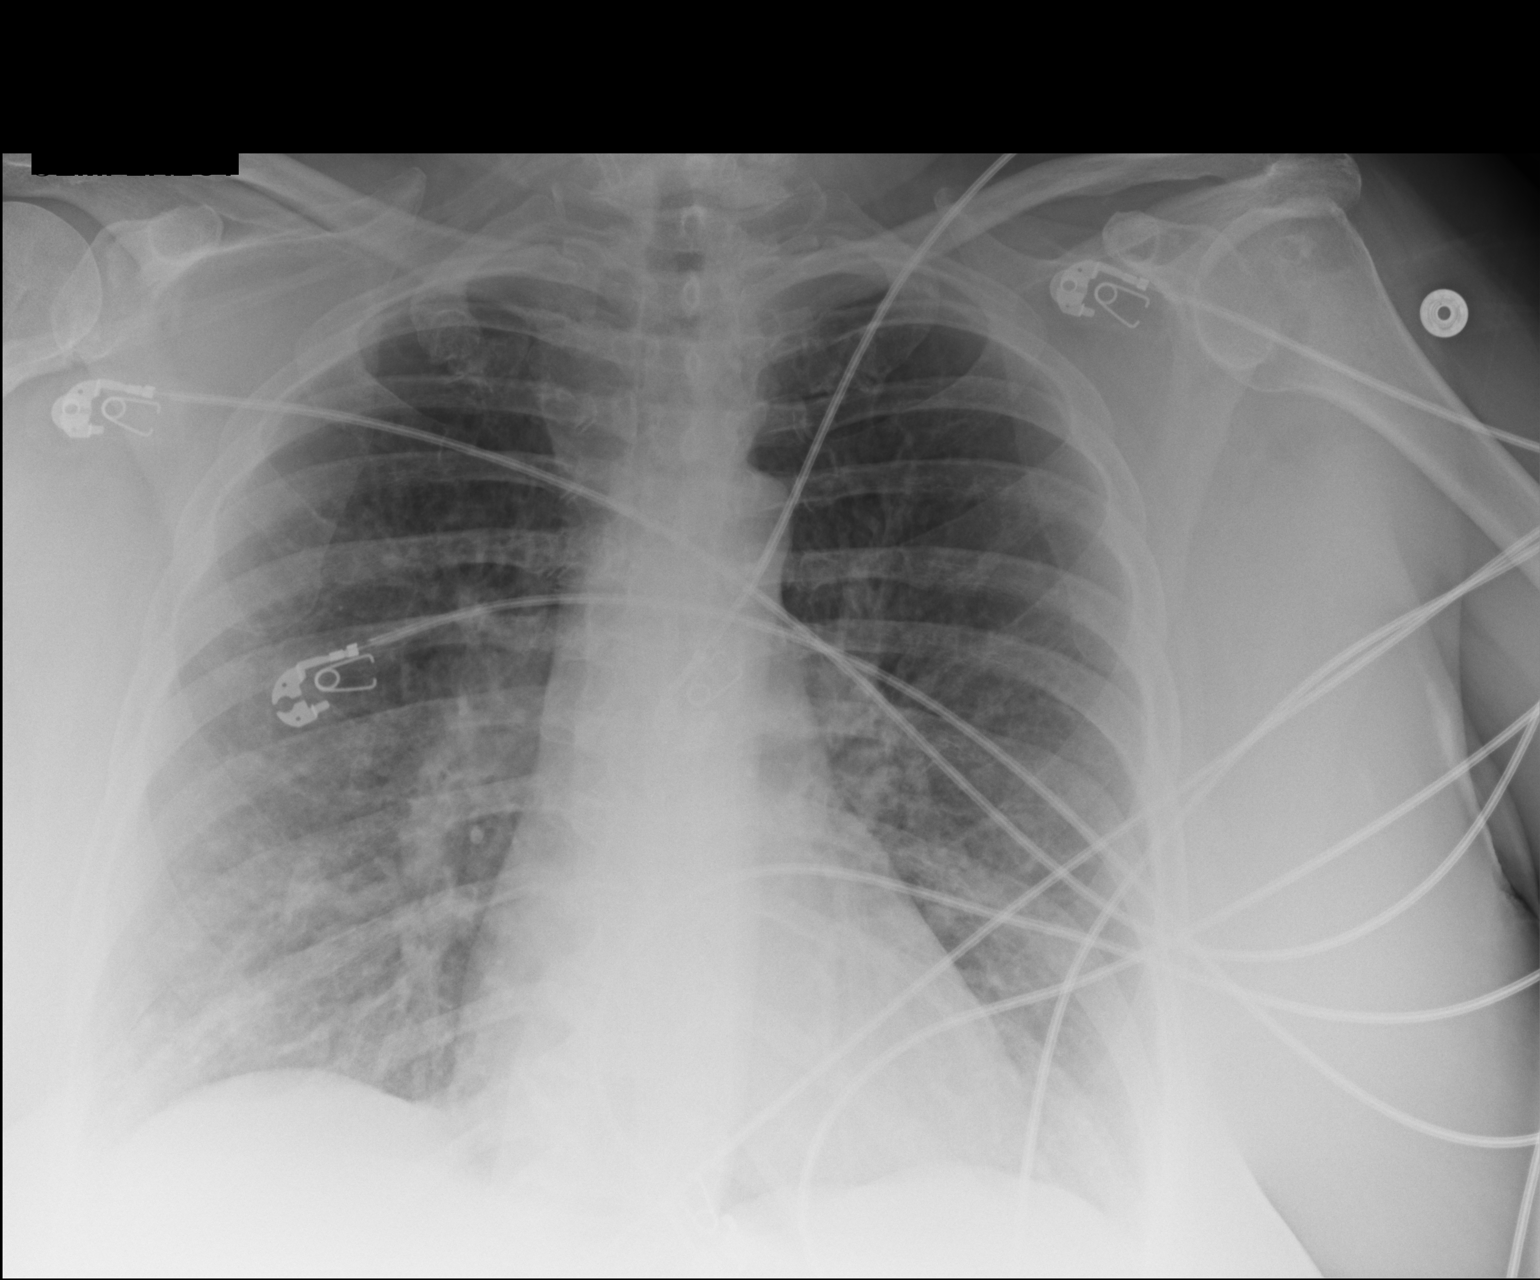

[1 of 1 positions shown; findings below may reference images not displayed]

FINDINGS: Mildly degraded exam due to AP portable technique and patient body
habitus. Numerous leads and wires project over the chest. Midline
trachea. Normal heart size. No definite pleural fluid. No
pneumothorax. Low lung volumes with resultant pulmonary interstitial
prominence. No lobar consolidation.
IMPRESSION: Low lung volumes, without definite acute disease.

Decreased sensitivity and specificity exam due to technique related
factors, as described above.

## 2019-02-10 IMAGING — DX DG CHEST 1V PORT
1 series · 1 of 1 positions shown · non-contrast
Comparison: 05/09/2018

CLINICAL DATA: Community-acquired pneumonia

EXAM:
PORTABLE CHEST 1 VIEW

[chest]
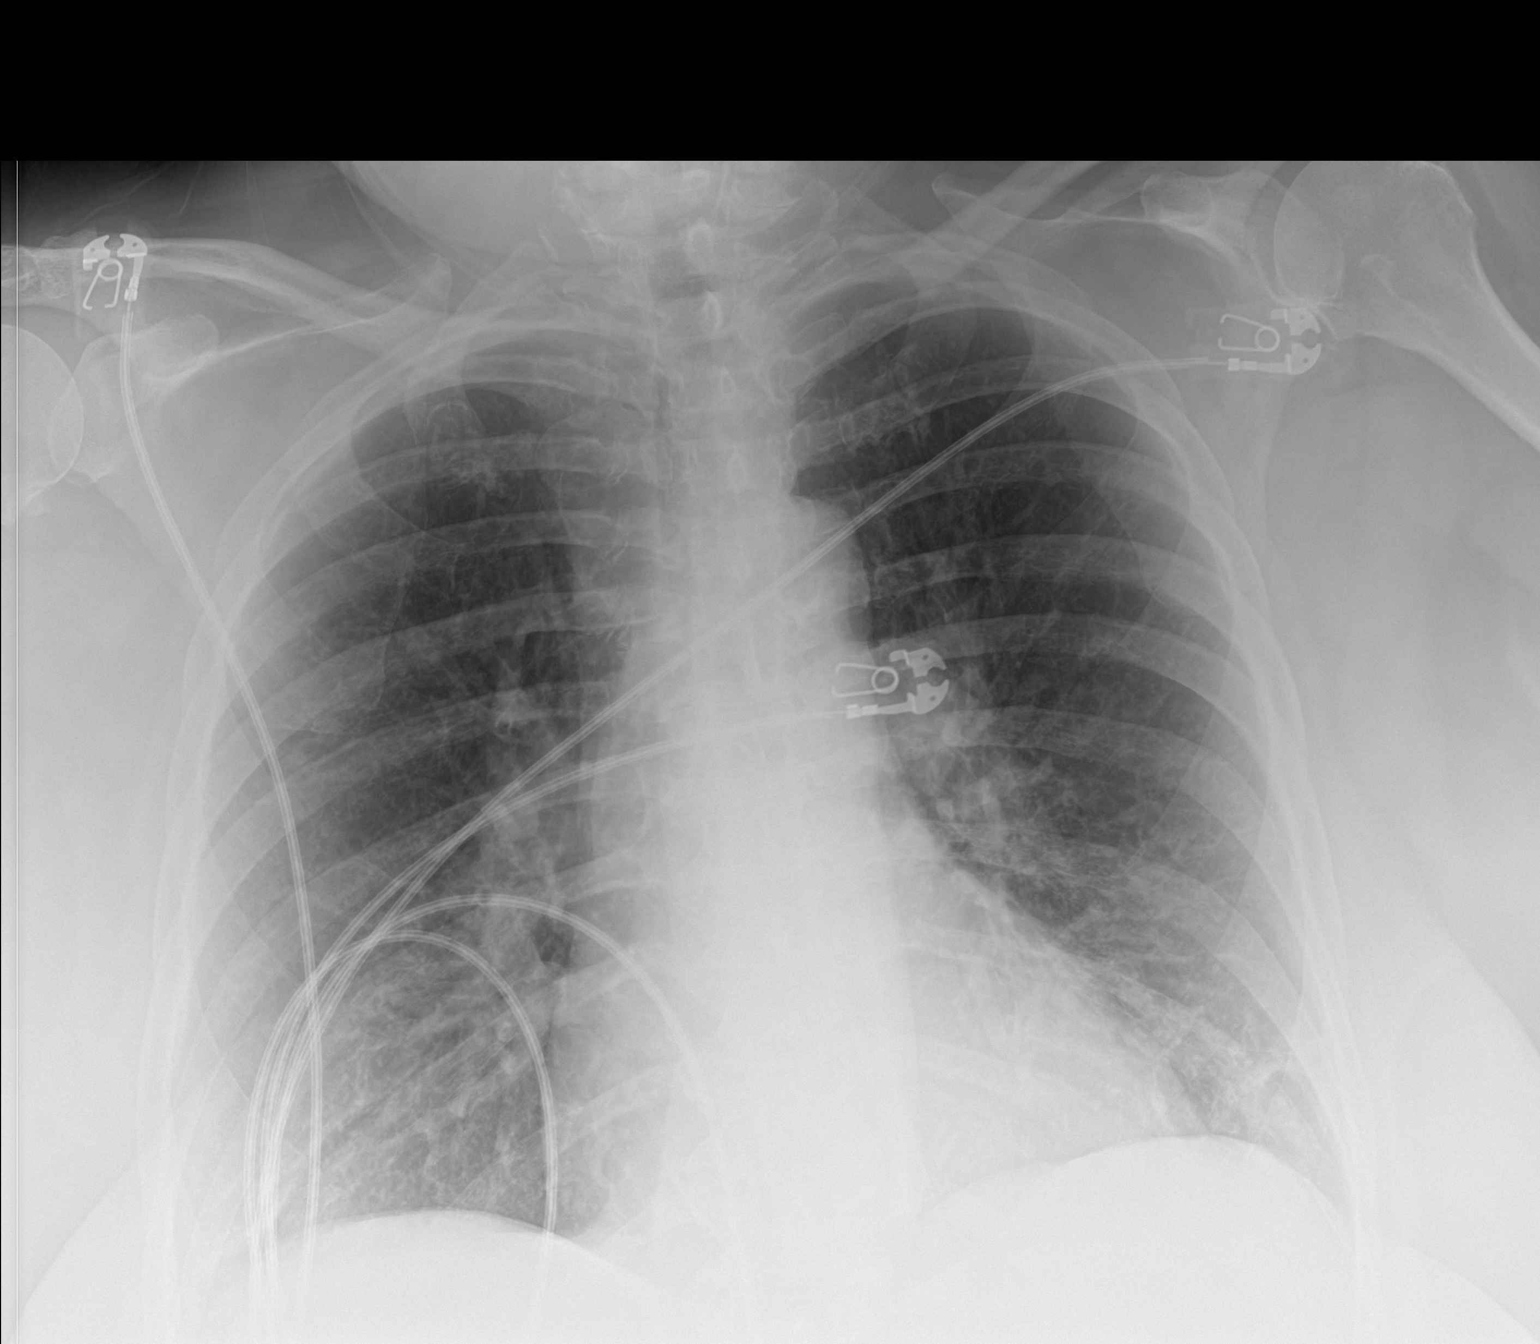

[1 of 1 positions shown; findings below may reference images not displayed]

FINDINGS: Heart size upper normal. Negative for heart failure. COPD with
hyperinflation.

Mild left lower lobe airspace disease has progressed in the interval
and may represent atelectasis or pneumonia. No effusion or heart
failure
IMPRESSION: Progression of mild left lower lobe atelectasis/infiltrate.  COPD.

## 2019-11-10 DIAGNOSIS — U071 COVID-19: Secondary | ICD-10-CM | POA: Diagnosis not present

## 2019-11-10 DIAGNOSIS — R110 Nausea: Secondary | ICD-10-CM | POA: Diagnosis not present

## 2019-11-10 DIAGNOSIS — R42 Dizziness and giddiness: Secondary | ICD-10-CM | POA: Diagnosis not present

## 2020-07-23 DIAGNOSIS — Z23 Encounter for immunization: Secondary | ICD-10-CM | POA: Diagnosis not present

## 2020-07-23 DIAGNOSIS — E785 Hyperlipidemia, unspecified: Secondary | ICD-10-CM | POA: Diagnosis not present

## 2020-07-23 DIAGNOSIS — I10 Essential (primary) hypertension: Secondary | ICD-10-CM | POA: Diagnosis not present

## 2020-07-23 DIAGNOSIS — J45909 Unspecified asthma, uncomplicated: Secondary | ICD-10-CM | POA: Diagnosis not present

## 2020-07-23 DIAGNOSIS — E119 Type 2 diabetes mellitus without complications: Secondary | ICD-10-CM | POA: Diagnosis not present

## 2020-07-24 ENCOUNTER — Other Ambulatory Visit (HOSPITAL_COMMUNITY): Payer: Self-pay | Admitting: Radiology

## 2020-07-24 ENCOUNTER — Other Ambulatory Visit: Payer: Self-pay | Admitting: Family Medicine

## 2020-07-24 DIAGNOSIS — J449 Chronic obstructive pulmonary disease, unspecified: Secondary | ICD-10-CM

## 2020-07-24 DIAGNOSIS — J45909 Unspecified asthma, uncomplicated: Secondary | ICD-10-CM

## 2021-09-27 DIAGNOSIS — E119 Type 2 diabetes mellitus without complications: Secondary | ICD-10-CM | POA: Diagnosis not present

## 2021-09-27 DIAGNOSIS — I10 Essential (primary) hypertension: Secondary | ICD-10-CM | POA: Diagnosis not present

## 2021-09-27 DIAGNOSIS — E785 Hyperlipidemia, unspecified: Secondary | ICD-10-CM | POA: Diagnosis not present

## 2021-09-27 DIAGNOSIS — J45909 Unspecified asthma, uncomplicated: Secondary | ICD-10-CM | POA: Diagnosis not present

## 2021-10-30 DIAGNOSIS — J449 Chronic obstructive pulmonary disease, unspecified: Secondary | ICD-10-CM | POA: Diagnosis not present

## 2021-10-30 DIAGNOSIS — U071 COVID-19: Secondary | ICD-10-CM | POA: Diagnosis not present

## 2021-10-30 DIAGNOSIS — E119 Type 2 diabetes mellitus without complications: Secondary | ICD-10-CM | POA: Diagnosis not present

## 2022-04-30 DIAGNOSIS — E785 Hyperlipidemia, unspecified: Secondary | ICD-10-CM | POA: Diagnosis not present

## 2022-04-30 DIAGNOSIS — J449 Chronic obstructive pulmonary disease, unspecified: Secondary | ICD-10-CM | POA: Diagnosis not present

## 2022-04-30 DIAGNOSIS — E119 Type 2 diabetes mellitus without complications: Secondary | ICD-10-CM | POA: Diagnosis not present

## 2023-01-28 DIAGNOSIS — J45909 Unspecified asthma, uncomplicated: Secondary | ICD-10-CM | POA: Diagnosis not present

## 2023-01-28 DIAGNOSIS — E1121 Type 2 diabetes mellitus with diabetic nephropathy: Secondary | ICD-10-CM | POA: Diagnosis not present

## 2023-01-28 DIAGNOSIS — E785 Hyperlipidemia, unspecified: Secondary | ICD-10-CM | POA: Diagnosis not present

## 2023-01-28 DIAGNOSIS — J449 Chronic obstructive pulmonary disease, unspecified: Secondary | ICD-10-CM | POA: Diagnosis not present

## 2023-05-13 DIAGNOSIS — E1121 Type 2 diabetes mellitus with diabetic nephropathy: Secondary | ICD-10-CM | POA: Diagnosis not present

## 2023-10-01 DIAGNOSIS — E785 Hyperlipidemia, unspecified: Secondary | ICD-10-CM | POA: Diagnosis not present

## 2023-10-01 DIAGNOSIS — I10 Essential (primary) hypertension: Secondary | ICD-10-CM | POA: Diagnosis not present

## 2023-10-01 DIAGNOSIS — E119 Type 2 diabetes mellitus without complications: Secondary | ICD-10-CM | POA: Diagnosis not present

## 2023-10-01 DIAGNOSIS — J449 Chronic obstructive pulmonary disease, unspecified: Secondary | ICD-10-CM | POA: Diagnosis not present

## 2023-11-24 DIAGNOSIS — L729 Follicular cyst of the skin and subcutaneous tissue, unspecified: Secondary | ICD-10-CM | POA: Diagnosis not present
